# Patient Record
Sex: Female | Born: 1985 | Race: White | Hispanic: No | Marital: Married
Health system: Midwestern US, Academic
[De-identification: ages and names within clinical notes are randomized; demographics above are authoritative.]

---

## 2021-03-21 IMAGING — CR [ID]
3 series · 3 of 3 positions shown · non-contrast
Comparison: none

[foot ap]
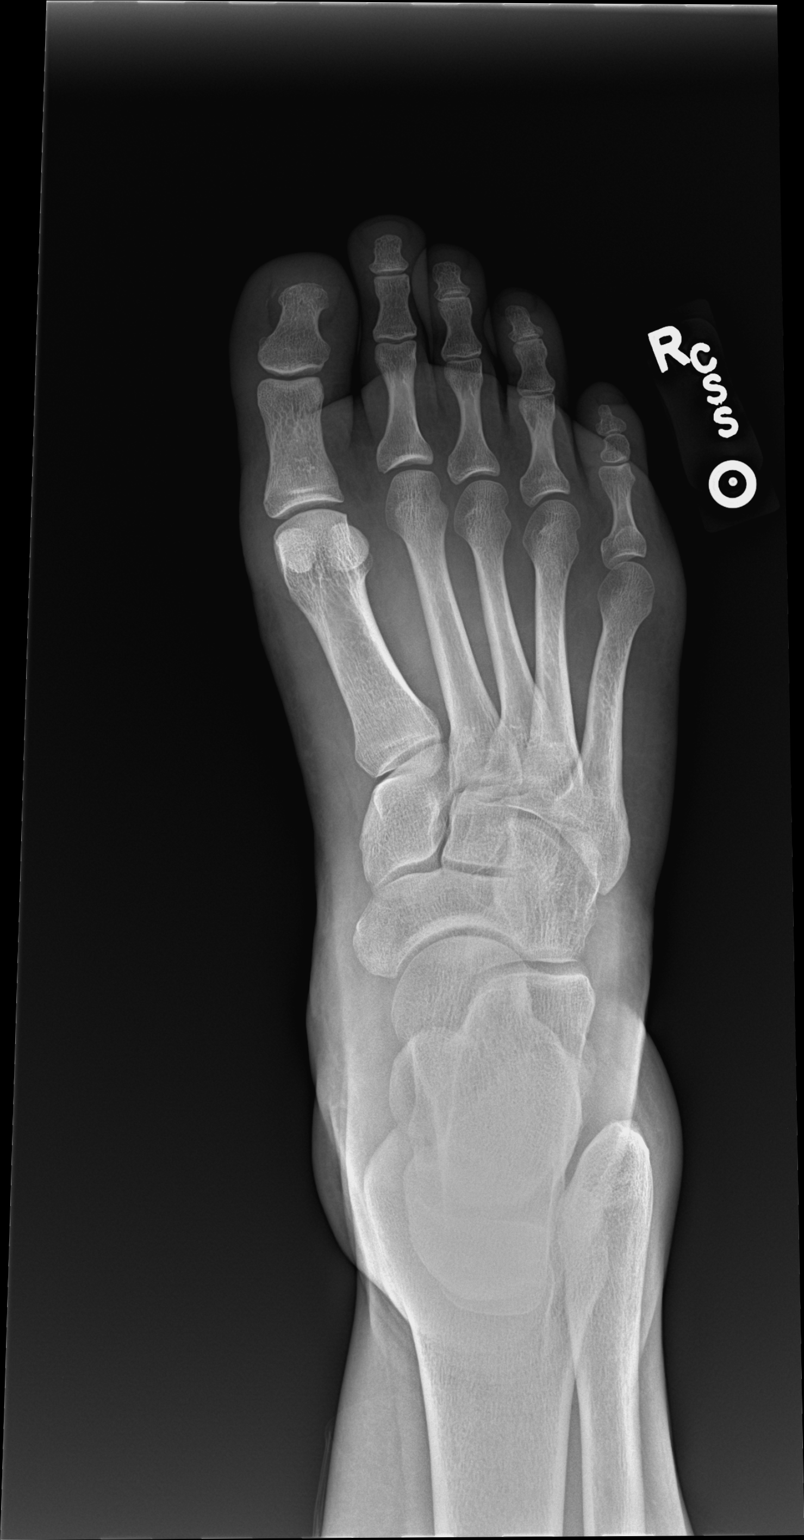

[foot obl]
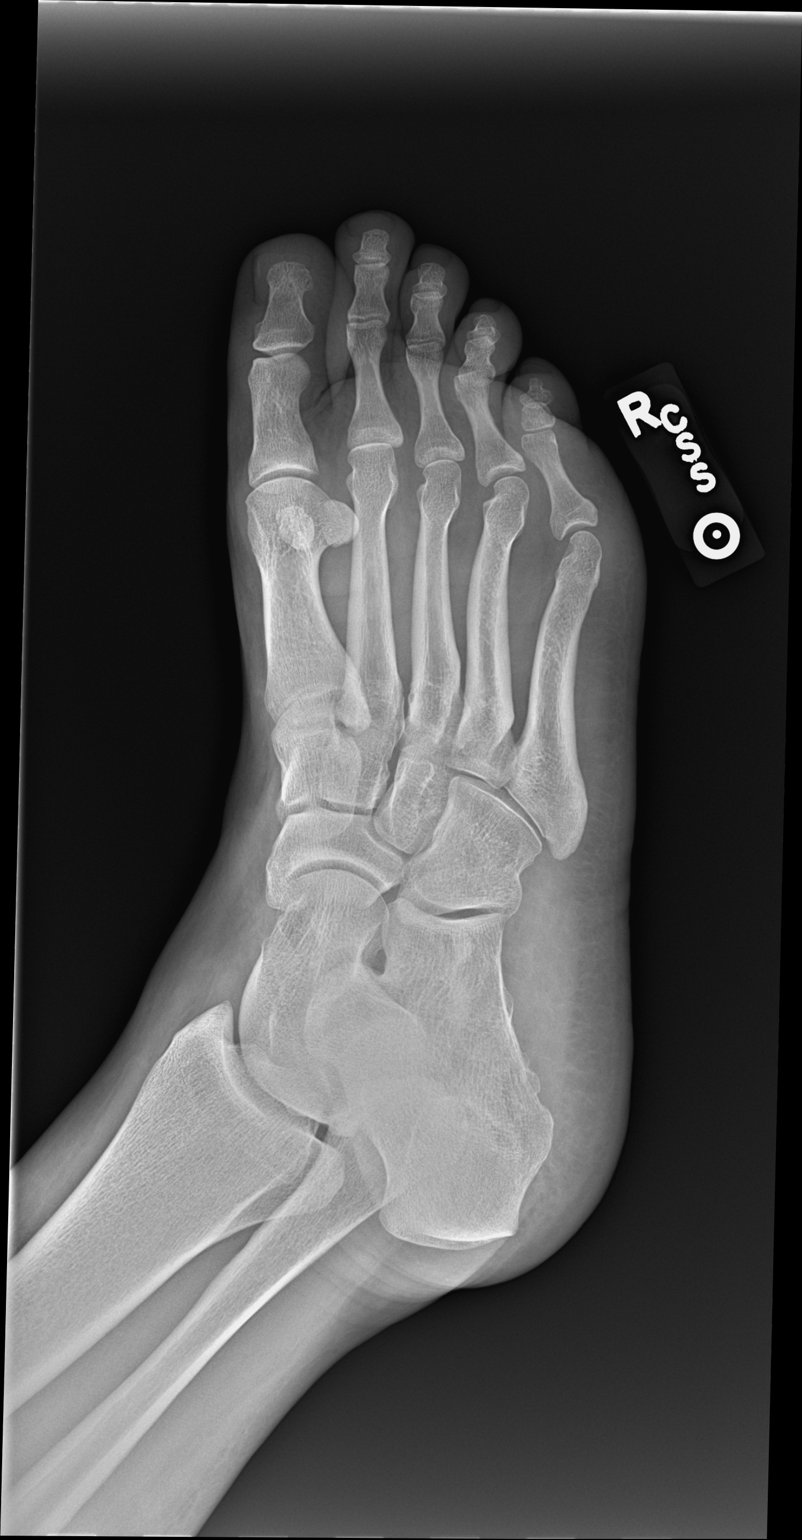

[foot lat]
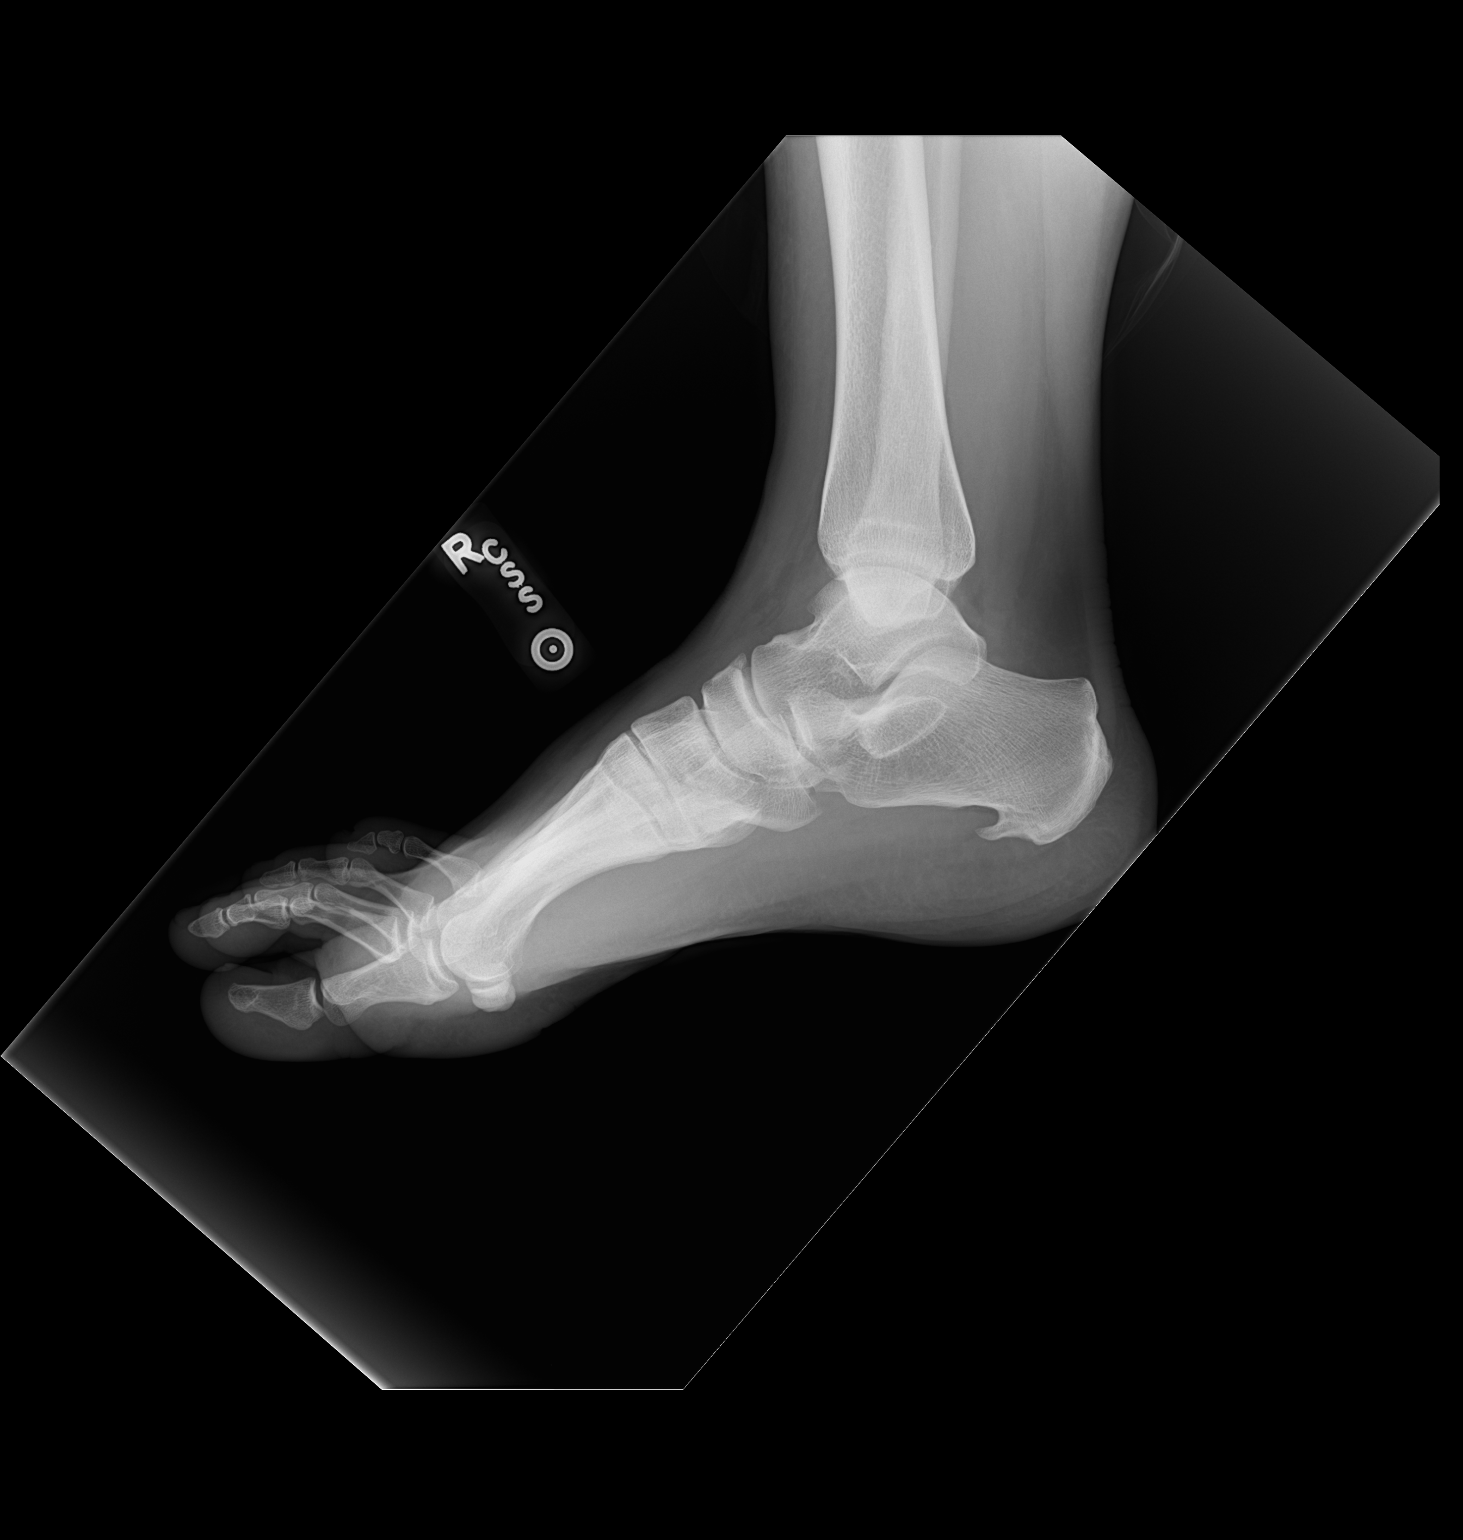

[3 of 3 positions shown; findings below may reference images not displayed]

EXAM

RADIOLOGICAL EXAMINATION, FOOT

INDICATION

Right foot pain x 1 month, worsening past few days
Right Foot Pain.CS

TECHNIQUE

3  views of the foot were acquired.

COMPARISONS

None

FINDINGS

The bone density and joint spaces are well maintained.

There are no displaced fractures.

No dislocation seen.

Calcaneal spurring is present

IMPRESSION

No acute bony abnormality.

Tech Notes:

Right Foot Pain.CS

## 2021-04-19 IMAGING — MR Foot^Routine
4 series · 40 of 40 positions shown · non-contrast
Comparison: none

[Series 3: PD fat-sat · oblique · 4.5mm · 0.47mm/px · 21 of 23 slices shown]
[im 1/23]
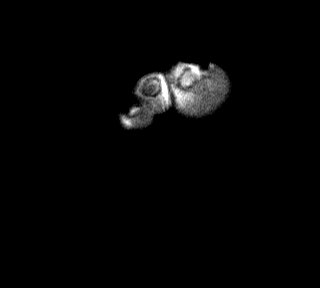
[im 2/23]
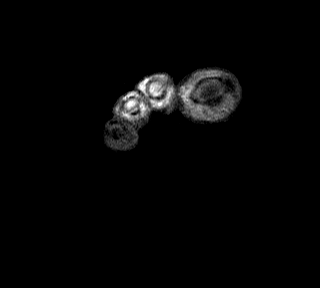
[im 3/23]
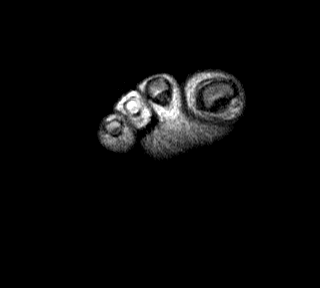
[im 4/23]
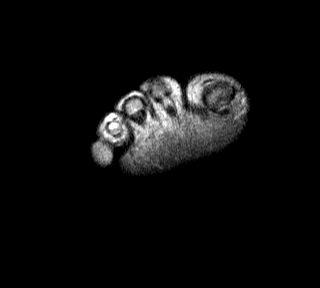
[im 5/23]
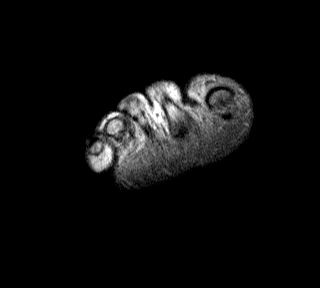
[im 6/23]
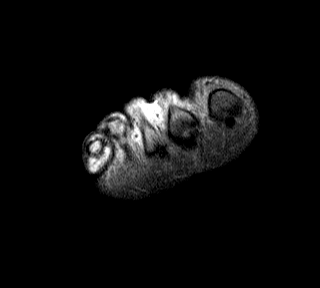
[im 7/23]
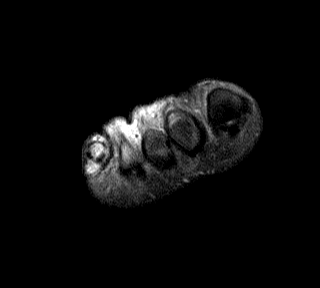
[im 8/23]
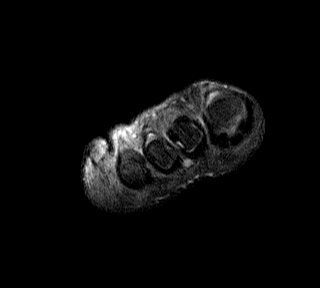
[im 9/23]
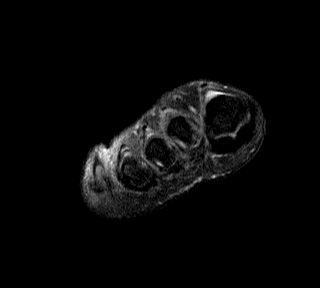
[im 10/23]
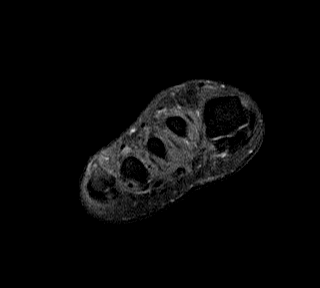
[im 12/23]
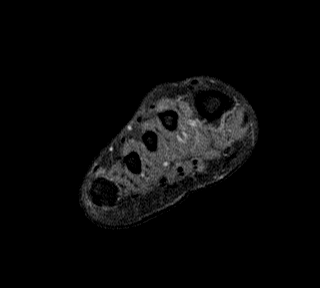
[im 13/23]
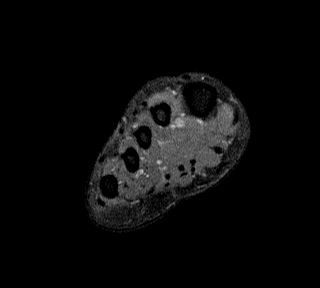
[im 14/23]
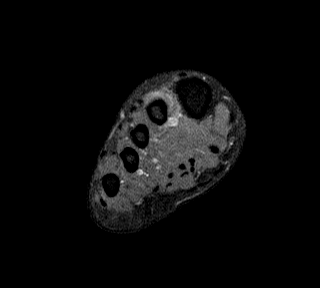
[im 15/23]
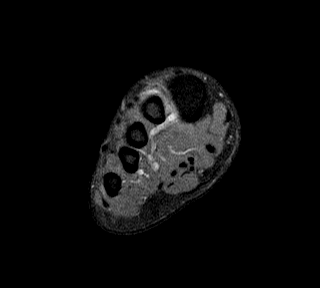
[im 16/23]
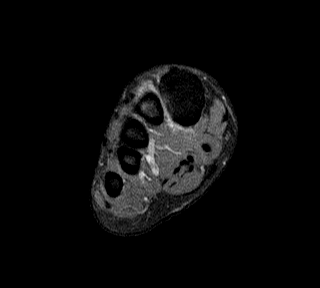
[im 17/23]
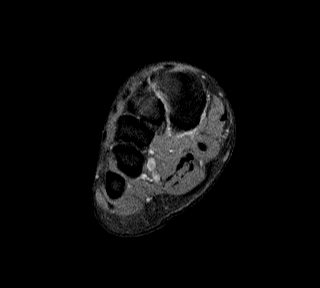
[im 18/23]
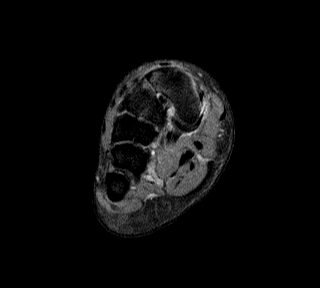
[im 19/23]
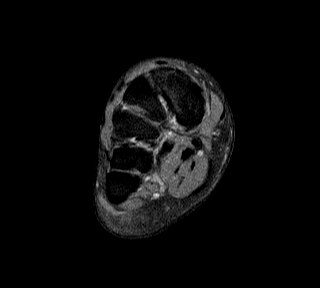
[im 20/23]
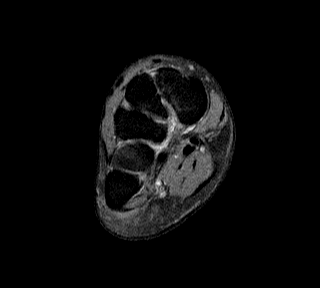
[im 21/23]
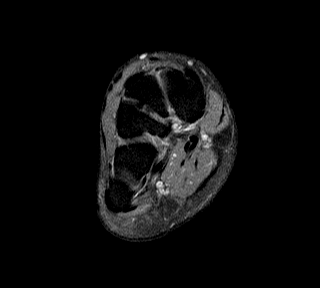
[im 23/23]
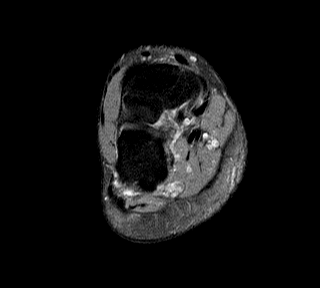

[Series 4: T1 · oblique · 4.0mm · 0.75mm/px · 12 of 13 slices shown (1 of 3)]
[im 1/13]
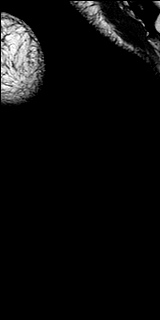
[im 2/13]
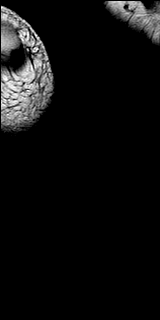
[im 3/13]
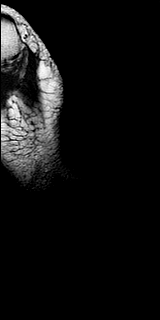
[im 4/13]
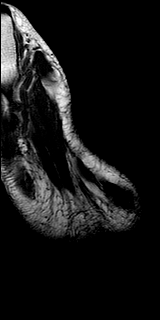
[im 5/13]
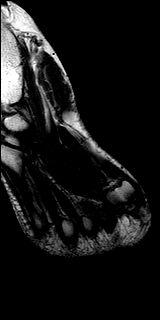
[im 6/13]
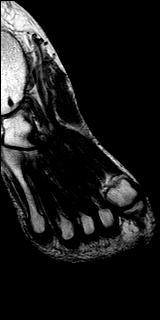
[im 7/13]
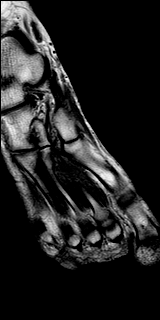
[im 8/13]
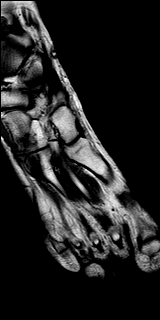
[im 9/13]
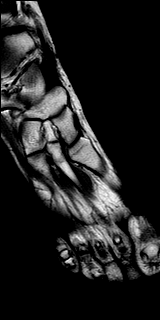
[im 10/13]
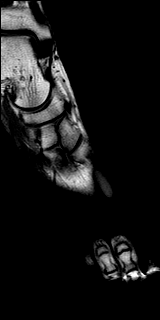
[im 11/13]
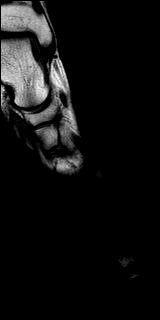
[im 13/13]
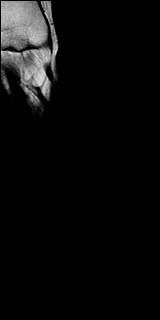

[Series 7: T1 · oblique · 3.5mm · 0.69mm/px · 3 of 3 slices shown (2 of 3)]
[im 1/3]
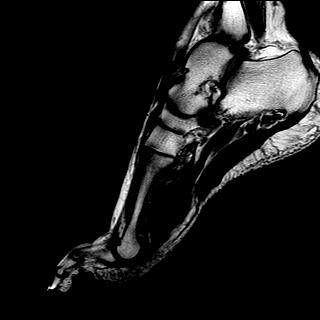
[im 2/3]
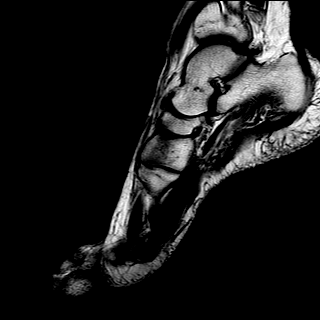
[im 3/3]
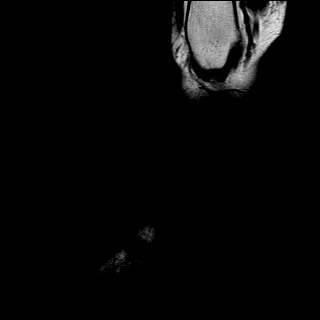

[Series 8: T1 · oblique · 4.0mm · 0.75mm/px · 4 of 4 slices shown (3 of 3)]
[im 1/4]
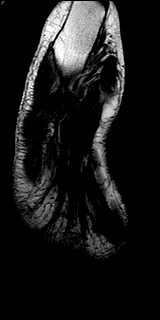
[im 2/4]
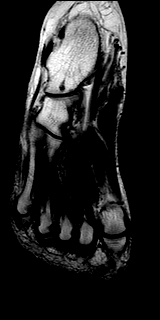
[im 3/4]
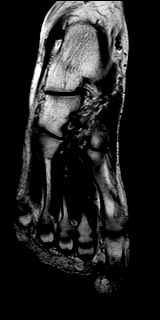
[im 4/4]
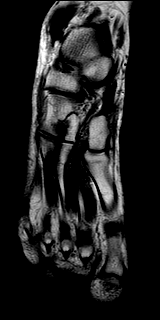

[40 of 40 positions shown; findings below may reference images not displayed]

EXAM

MRI right foot without contrast

INDICATION

pain
RT TOP OF FOOT PAIN, NOW RADIATING TO PLANTAR SURFACE, WORSE BETWEEN THE 1ST AND 2ND METATARSAL.
HX PLANTAR FASCITIS WITH SX TREATMENT.  RG

FINDINGS

Coronal T1, fat suppressed proton density and stir, sagittal T1 and stir and axial proton density
and stir images of the right foot were obtained.

There is a linear areas signal abnormality and marrow edema throughout the base of the 2nd
metatarsal.

The 1st, 3rd, 4th and 5th metatarsals appear normal. There is mild marrow edema within the distal
and lateral portion of the medial cuneiform.

The middle and lateral cuneiforms and cuboid are intact.

No widening of the Lisfranc joint space is seen. There is mild edema of the Lisfranc ligament.

IMPRESSION

There is evidence of a nondisplaced stress fracture of the base of the 2nd metatarsal with
associated marrow edema. There is also marrow edema of distal lateral portion of the medial cuneifo
rm, most likely representing stress response. There is mild adjacent soft tissue edema. There is
mild edema of the Lisfranc ligament, suggestive of a ligament strain.

Tech Notes:

RT TOP OF FOOT PAIN, NOW RADIATING TO PLANTAR SURFACE, WORSE BETWEEN THE 1ST AND 2ND METATARSAL.  HX
PLANTAR FASCITIS WITH SX TREATMENT.  RG

## 2021-04-19 IMAGING — CR ANKCMLT
3 series · 3 of 3 positions shown · non-contrast
Comparison: none

[ankle ap]
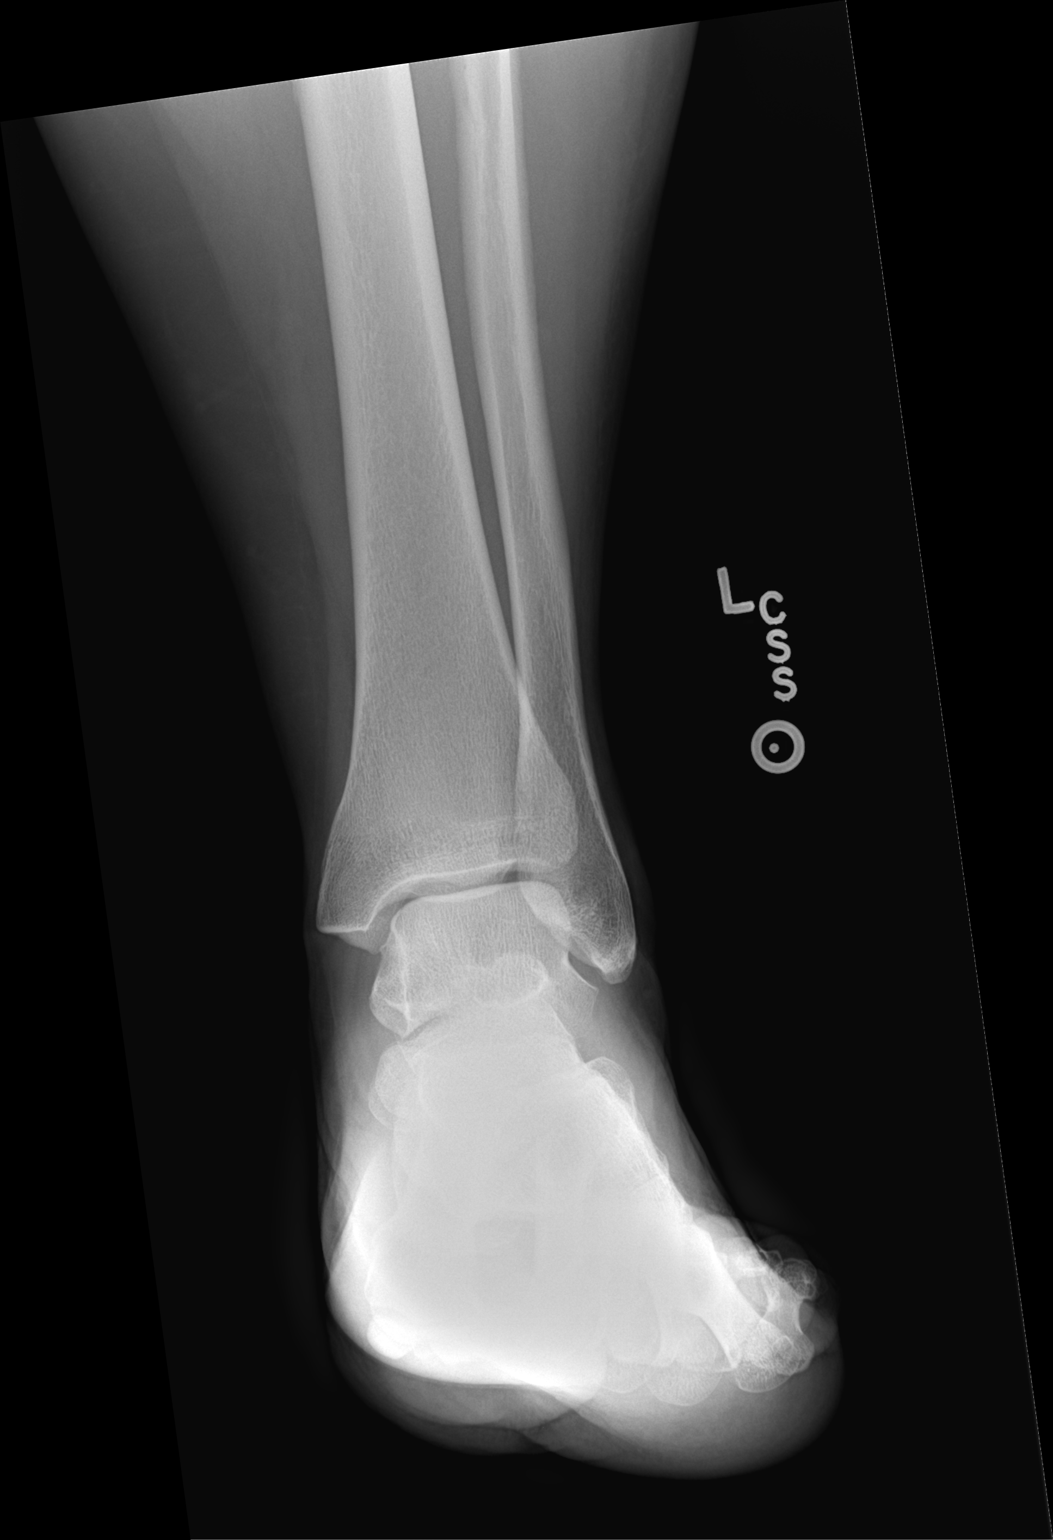

[ankle obl]
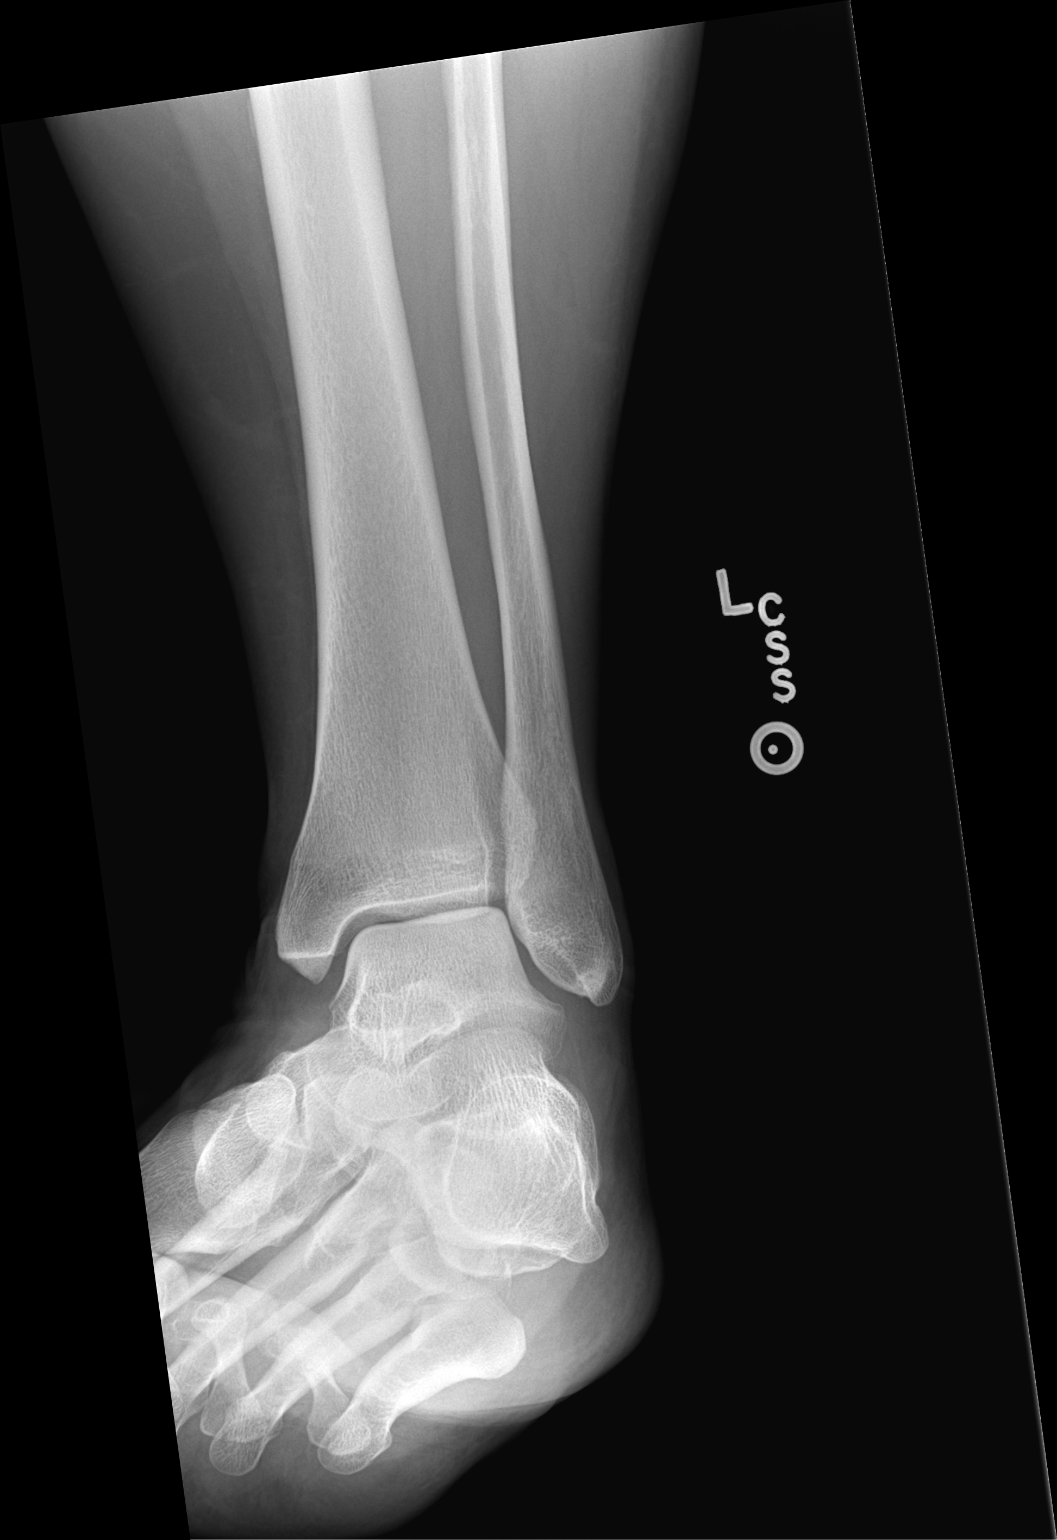

[ankle lat]
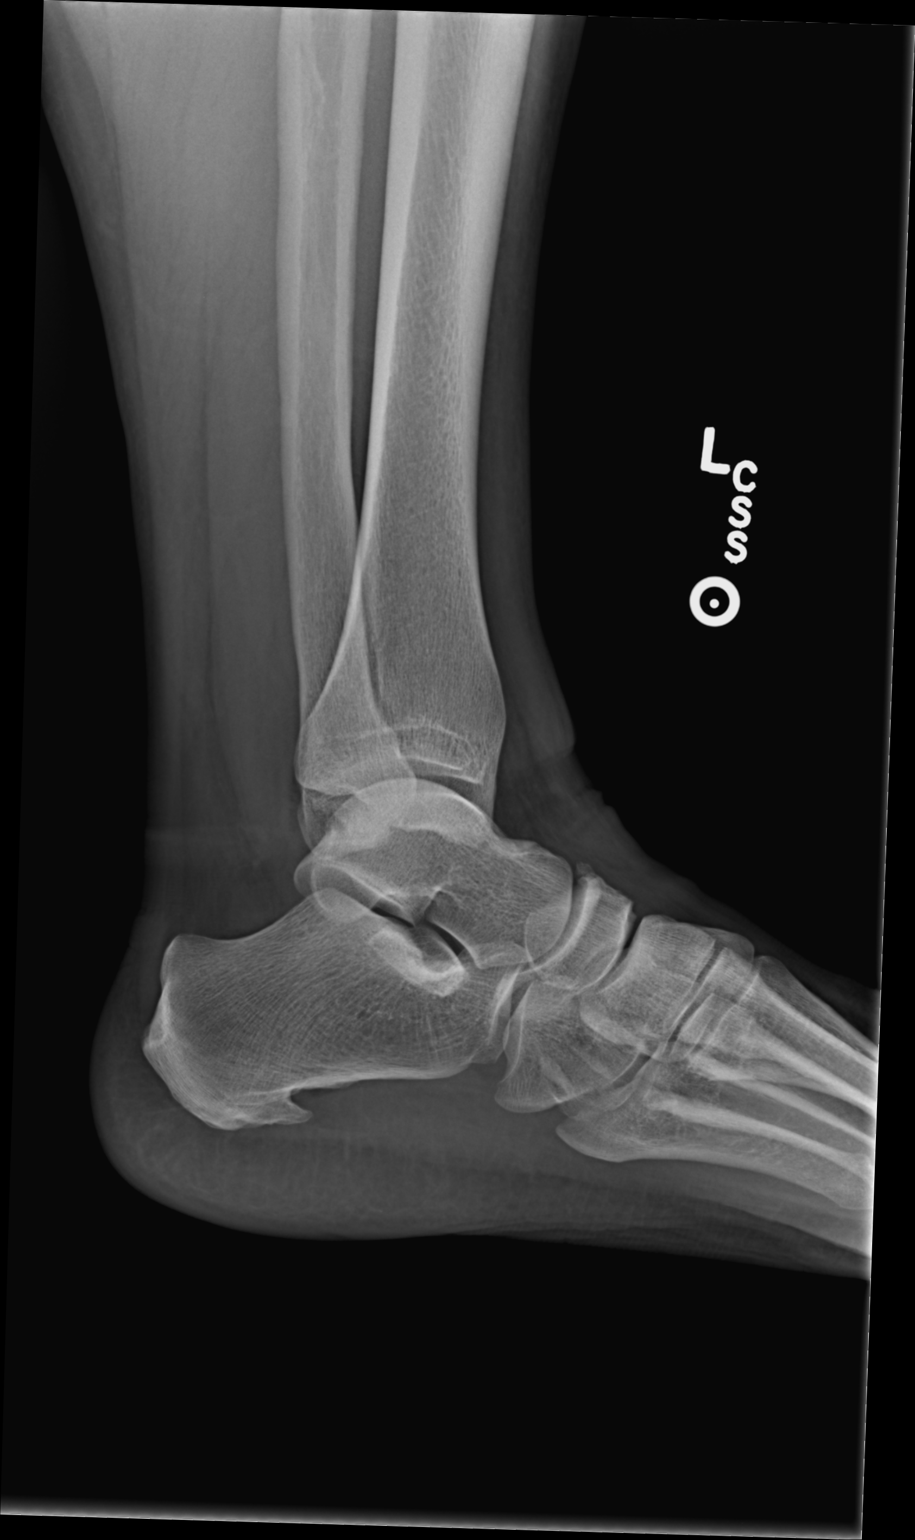

[3 of 3 positions shown; findings below may reference images not displayed]

EXAM

Left ankle

INDICATION

ankle pain
Left ankle pain. Stepped in a hole about 3 weeks ago and pain started a couple days later. Pain is
on the mdial side locally to the ankle area. CS

FINDINGS

Three views of the left ankle were obtained.

There is no evidence of fracture or dislocation.

There is a small heel spur at the insertion site of the plantar fascia.

IMPRESSION

There is no significant radiographic abnormality of the left ankle. There is a small heel spur.

Tech Notes:

Left ankle pain. Stepped in a hole about 3 weeks ago and pain started a couple days later. Pain is
on the mdial side locally to the ankle area. CS

## 2021-08-29 IMAGING — CT ABDOMEN_PELVIS WO(Adult)
2 of 3 series · 12 of 46 positions shown, 14 images · non-contrast
Comparison: none

[Series 2: abdomen_pelvis ax 3.00 br40 s3 · axial · 0.66mm/px · z∈[+1217,+1667]mm · 9 of 174 slices shown, 11 images]
[im 12/174  soft-tissue]
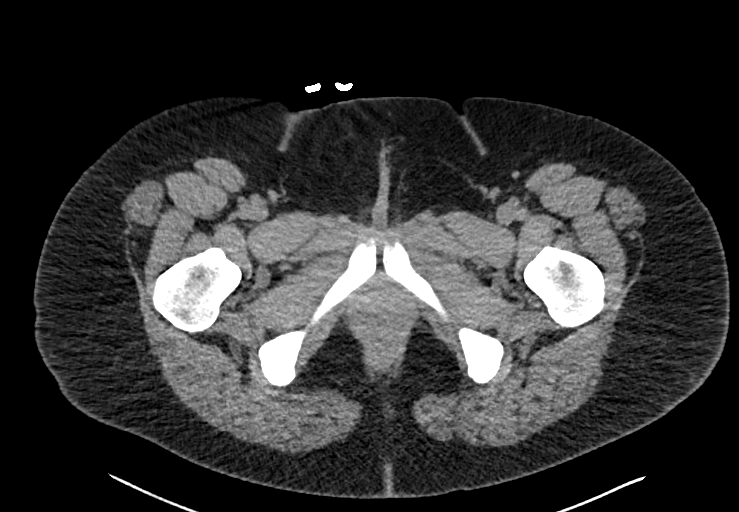
[im 12/174  bone]
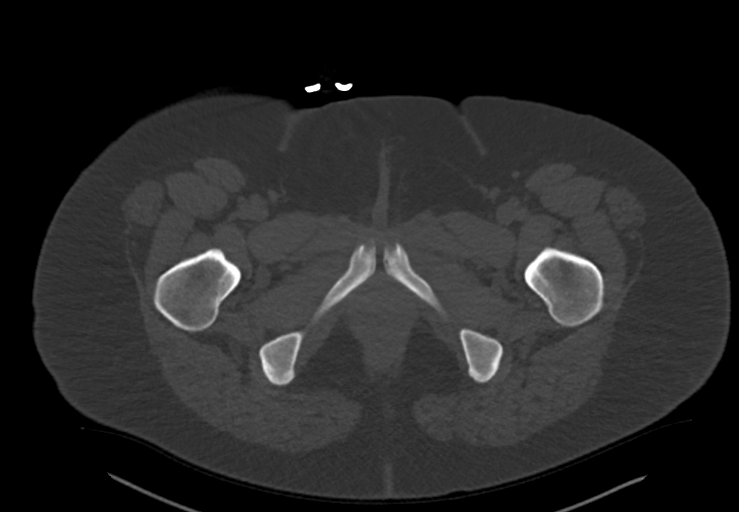
[im 34/174  soft-tissue]
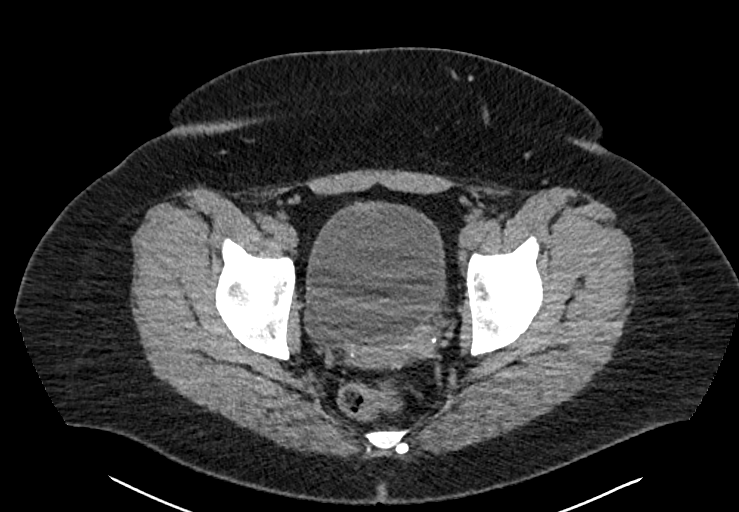
[im 51/174  soft-tissue]
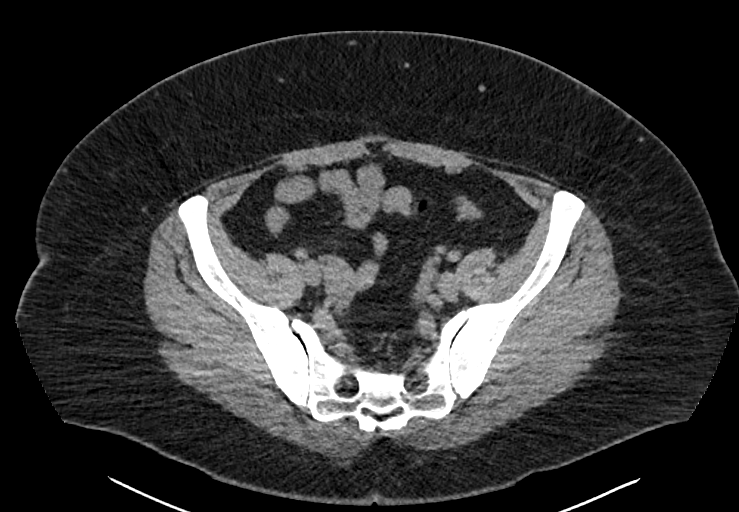
[im 67/174  soft-tissue]
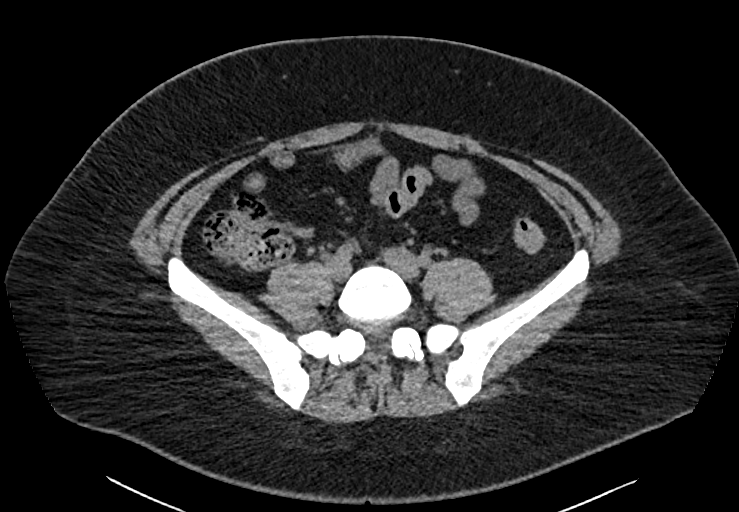
[im 90/174  soft-tissue]
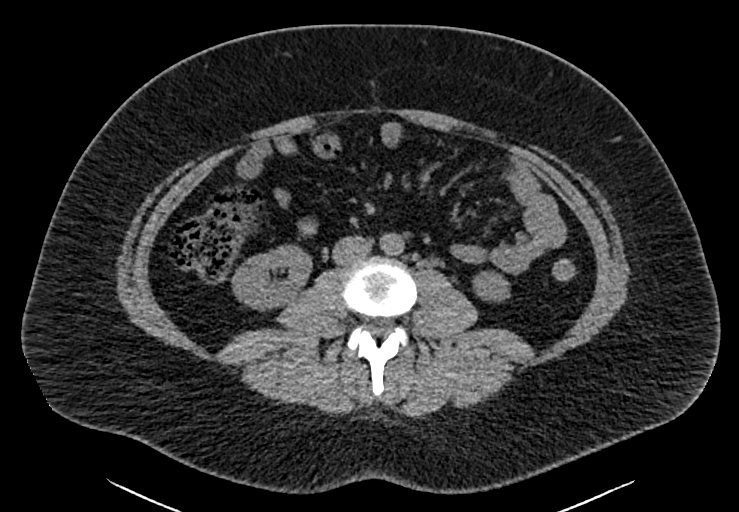
[im 107/174  soft-tissue]
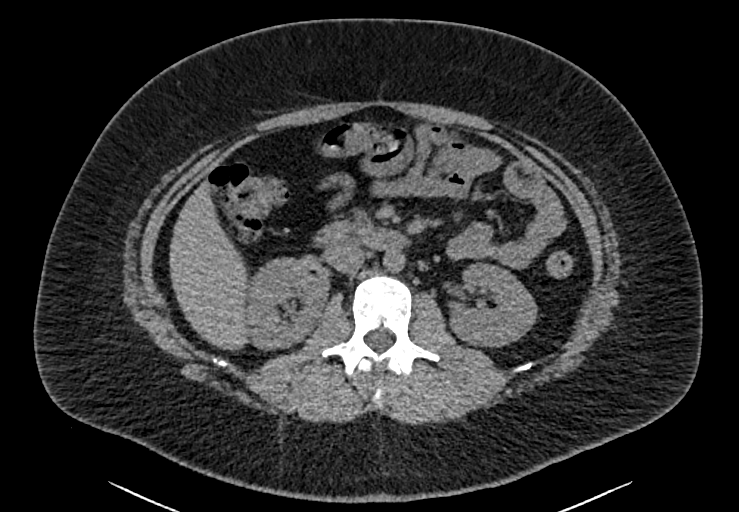
[im 123/174  soft-tissue]
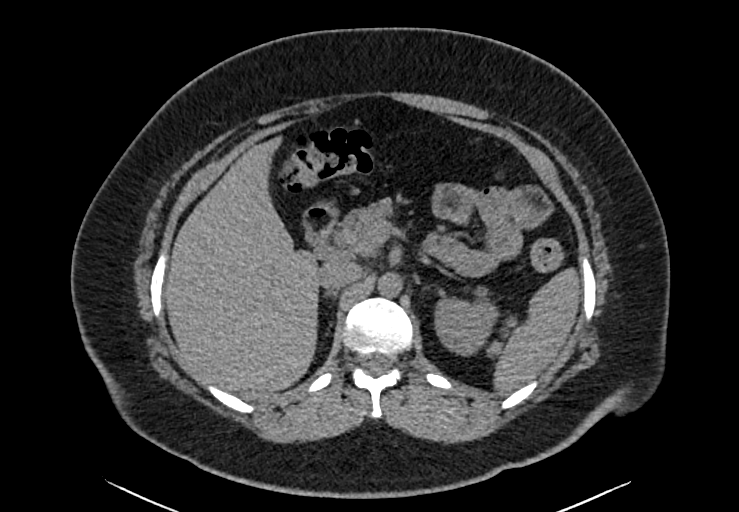
[im 146/174  soft-tissue]
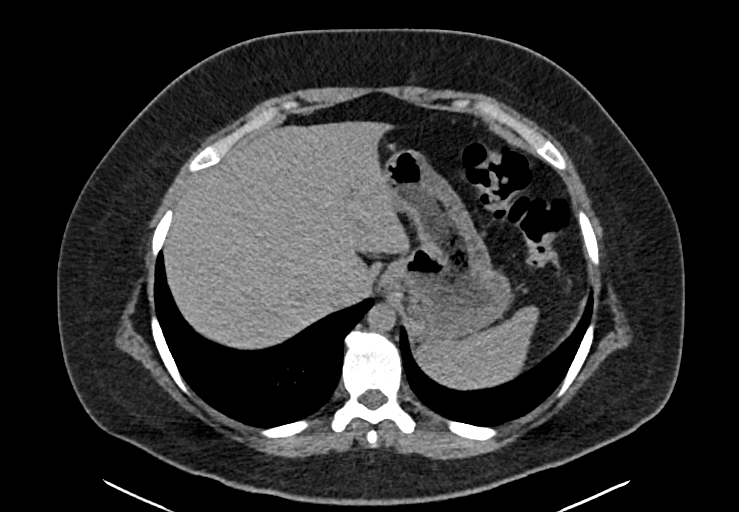
[im 162/174  soft-tissue]
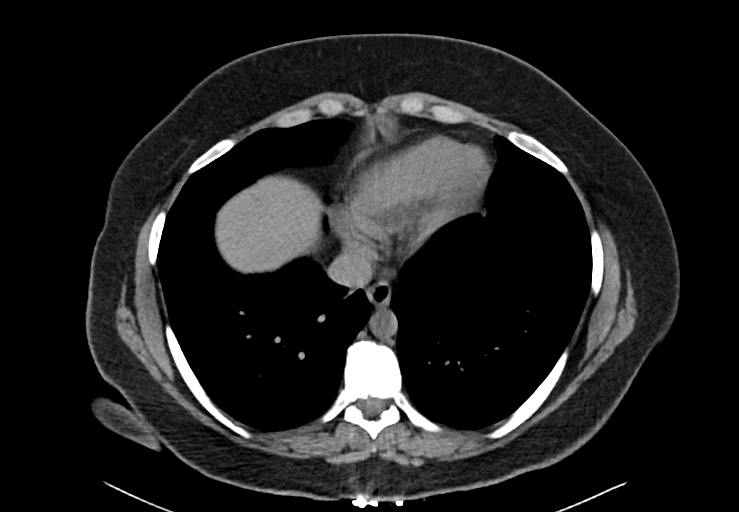
[im 162/174  bone]
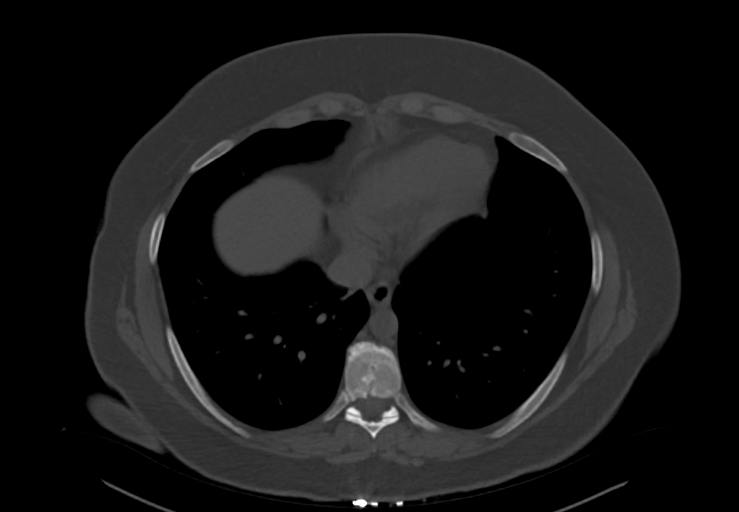

[Series 4: abdomen_pelvis cor 3.00 br40 s3 · coronal · 0.96mm/px · 3 of 113 slices shown]
[im 38/113  soft-tissue]
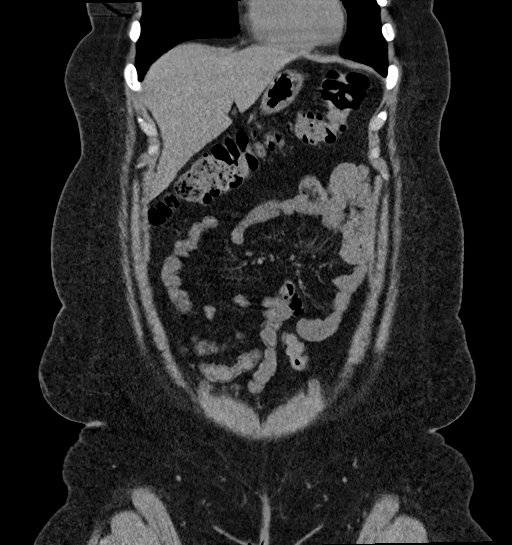
[im 50/113  soft-tissue]
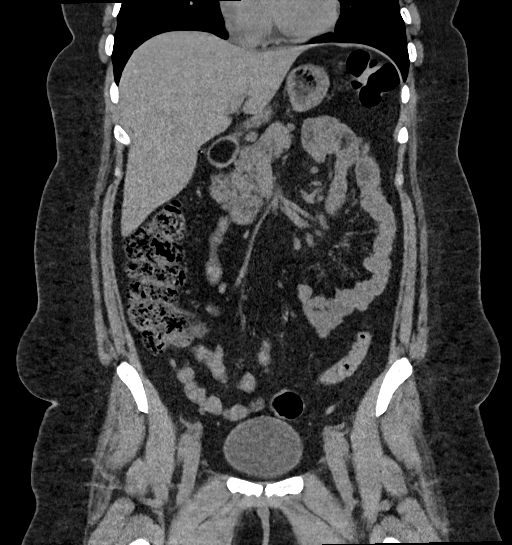
[im 63/113  soft-tissue]
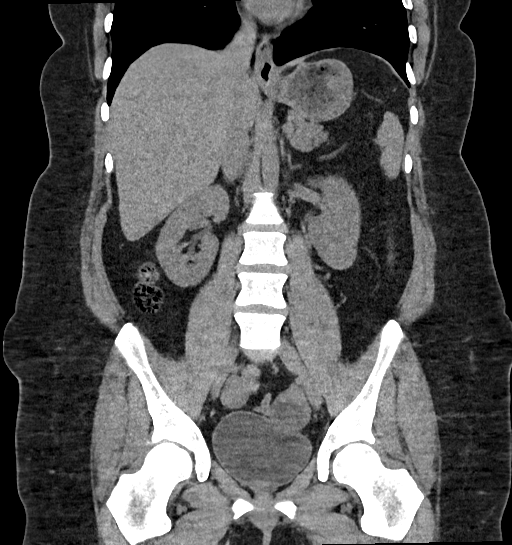

[12 of 46 positions shown; findings below may reference images not displayed]

EXAM

CT abd/pel wo con

INDICATION

lower pain  bloody stool
lower abd/pelvis pain for the last few days that is increasing- bloody stool, hx of ovarian cysts,
hx of hysterectomy, cholecystectomy, no trauma to abd. ct/nm 0/0

TECHNIQUE

CT of the abdomen and pelvis was performed. All CT scans at this facility use dose modulation,
iterative reconstruction, and/or weight based dosing when appropriate to reduce radiation dose to as
low as reasonably achieved.

# of CT scans in the past year: 0

# of Myocardial perfusion scans this past year: 0

COMPARISONS

None available at the time of dictation.

FINDINGS

Lung bases: No pleural effusion or suspicious pulmonary nodule in the lung bases. Normal cardiac
size without a pericardial effusion.

Liver: The liver is enlarged measuring 21 cm in the right midclavicular line

Gallbladder and Biliary Tree: The gallbladder is been removed. No intrahepatic or extrahepatic
biliary dilation.

Spleen: Unremarkable

Pancreas: Unremarkable

Adrenal Glands: Unremarkable

Kidneys: No hydronephrosis or hydroureter is present.

Bladder: Unremarkable for the degree of distention.

Pelvic Organs: A left-sided ovarian cyst is present measuring up to 4.7 cm. Hysterectomy changes
are seen.

Bowel: The stomach is normal.   There is normal caliber of the small and large bowel without
evidence of bowel obstruction.   A normal appendix is visualized in the right lower quadrant.  There
is no free air.

Ascites: Absent

Lymphadenopathy: No pathologically enlarged or morphologically abnormal lymph nodes by CT
appearance.

Vasculature: There is normal opacification of the visualized abdominal/pelvic vasculature without
evidence of stenosis or aneurysmal dilation.

Abdominal Wall and Mesentery: Unremarkable

Musculoskeletal: Degenerative changes of the visualized spine without evidence of an aggressive
osseous lesion or fracture.

IMPRESSION
1. No CT evidence of an acute abdominal or pelvic process.
2. A 4.7 cm left-sided ovarian cyst is present.
3. Hepatomegaly.

Tech Notes:

lower abd/pelvis pain for the last few days that is increasing- bloody stool, hx of ovarian cysts,
hx of hysterectomy, cholecystectomy, no trauma to abd. ct/nm 0/0

## 2021-12-11 ENCOUNTER — Encounter: Admit: 2021-12-11 | Discharge: 2021-12-11

## 2021-12-14 ENCOUNTER — Encounter: Admit: 2021-12-14 | Discharge: 2021-12-14

## 2021-12-24 ENCOUNTER — Encounter: Admit: 2021-12-24 | Discharge: 2021-12-24 | Payer: BC Managed Care – PPO

## 2021-12-24 DIAGNOSIS — R69 Illness, unspecified: Secondary | ICD-10-CM

## 2022-01-10 ENCOUNTER — Ambulatory Visit: Admit: 2022-01-10 | Discharge: 2022-01-10 | Payer: BC Managed Care – PPO

## 2022-01-10 ENCOUNTER — Encounter: Admit: 2022-01-10 | Discharge: 2022-01-10 | Payer: BC Managed Care – PPO

## 2022-01-10 DIAGNOSIS — R519 Generalized headaches: Secondary | ICD-10-CM

## 2022-01-10 DIAGNOSIS — M5030 Other cervical disc degeneration, unspecified cervical region: Secondary | ICD-10-CM

## 2022-01-10 DIAGNOSIS — D539 Nutritional anemia, unspecified: Secondary | ICD-10-CM

## 2022-01-10 DIAGNOSIS — M5136 Other intervertebral disc degeneration, lumbar region: Secondary | ICD-10-CM

## 2022-01-10 DIAGNOSIS — M461 Sacroiliitis, not elsewhere classified: Secondary | ICD-10-CM

## 2022-01-10 DIAGNOSIS — R112 Nausea with vomiting, unspecified: Secondary | ICD-10-CM

## 2022-01-10 DIAGNOSIS — F32A Depression: Secondary | ICD-10-CM

## 2022-01-10 DIAGNOSIS — M2550 Pain in unspecified joint: Secondary | ICD-10-CM

## 2022-01-10 DIAGNOSIS — F419 Anxiety disorder, unspecified: Secondary | ICD-10-CM

## 2022-01-10 DIAGNOSIS — M47816 Spondylosis without myelopathy or radiculopathy, lumbar region: Secondary | ICD-10-CM

## 2022-01-10 DIAGNOSIS — M7918 Myalgia, other site: Secondary | ICD-10-CM

## 2022-01-10 MED ORDER — BUPIVACAINE (PF) 0.5 % (5 MG/ML) IJ SOLN
4 mL | Freq: Once | INTRAMUSCULAR | 0 refills | Status: CP | PRN
Start: 2022-01-10 — End: ?
  Administered 2022-01-10: 18:00:00 4 mL via INTRAMUSCULAR

## 2022-01-10 MED ORDER — METHOCARBAMOL 500 MG PO TAB
500 mg | ORAL_TABLET | Freq: Three times a day (TID) | ORAL | 1 refills | Status: AC | PRN
Start: 2022-01-10 — End: ?

## 2022-01-10 NOTE — Progress Notes
[facility]         HISTORY OF PRESENT ILLNESS:      Referring physician: Lucita Lora    Chief complaint: Pain of the Lower Back and Pain of the Neck      01/10/22  Sophia Wood is a 36 y.o. female who  has a past medical history of Anxiety, Degenerative disc disease, cervical, Degenerative disc disease, lumbar, Depression (2008), Generalized headaches (1995), Joint pain, PONV (postoperative nausea and vomiting), and Unspecified deficiency anemia.  She primarily presents today for low back pain.  This started several years ago but became much worse in early 2021.  It has progressively worsened further since that time.  The pain is constant, aching, and sharp.  The pain is moderate to severe.  It does not radiate to the lower extremities.  She denies any numbness, tingling, or weakness.  She denies any particular injury that caused this pain.  She has tried physical therapy on several occasions with ongoing home exercises which have not provided much relief.  She has tried ibuprofen, diclofenac, gabapentin, Flexeril, and tizanidine without much benefit.  She has seen a couple of pain physicians.  She has severe body pain with steroid injections, that she has never had any steroid injections for her low back problems.  She had lumbar medial branch blocks which helped temporarily, but she actually felt worse after her lumbar RFA.  The most effective procedure thus far has been bilateral SI joint injections of ropivacaine only.  This was in January 2023.  She reported 80% relief lasting about 45 minutes.  She did have some numbness temporarily in her right leg after the injection.  She has never had any surgery on her lower back.  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    Her secondary complaint is neck pain.  This has been present since before age 17.  She believes it started on around the time that she dove into a lake and hit her head.  She is also tried physical therapy several times for this.  She has rarely noticed radiation of pain into the right shoulder and the arm, but this is mostly just in her bilateral neck.  The pain is constant and aching.  She has trouble getting comfortable.  She sometimes has headaches when her neck pain is more severe.      Patient-entered data is noted with quotes.    Pain location: (P) Lower back  Pain radiation:   Does the pain move into your arm or leg?: (P) Yes  Explanation of how far pain moves:: (P) Occasionally moves into leg, could be right or left  Pain start date: (P) Greater than 1 year     Inciting event: (P) No       Description of pain: (P) Aching, Tiring, Nagging, Burning, Sharp    Numbness/tingling/weakness: (P) None      Bladder or bowel retention or incontinence?: No  Exacerbating factors: (P) Stand, Walk      Alleviating factors: (P) Sitting down      Imaging:   Have you had an X-ray for this condition?: (P) Yes  Have you had an MRI for this condition?: (P) Yes  Physical therapy/home exercise:   Have you had physical therapy for this condition?: (P) Yes  What was the approximate date physical therapy was started?: (P) 11/22/19  How long did you do physical therapy? (in weeks): (P) 26  Anticoagulants:  Are you taking one of the following blood thinners?: (P) No      Pain diagram:               Medical History:   Diagnosis Date   ? Anxiety    ? Degenerative disc disease, cervical    ? Degenerative disc disease, lumbar    ? Depression 2008    Remission   ? Generalized headaches 1995   ? Joint pain    ? PONV (postoperative nausea and vomiting)    ? Unspecified deficiency anemia          Surgical History:   Procedure Laterality Date   ? ABDOMEN SURGERY     ? HX ARTHROSCOPIC SURGERY      B) knees   ? HX HYSTERECTOMY  2021   ? HX TONSILLECTOMY  2022   ? KNEE SURGERY  2012, 2020         Allergies   Allergen Reactions   ? Flonase [Fluticasone] RASH   ? Corticosteroids (Glucocorticoids) SEE COMMENTS     Joint pain   ? Latex ITCHING         Current Outpatient Medications on File Prior to Visit   Medication Sig Dispense Refill   ? CHOLEcalciferoL (vitamin D3) (VITAMIN D3) 5000 unit tablet      ? cyclobenzaprine (FLEXERIL) 5 mg tablet as Needed.     ? desvenlafaxine succinate (PRISTIQ) 100 mg tablet Take one tablet by mouth every morning.     ? hydrOXYzine pamoate (VISTARIL) 25 mg capsule TAKE 1-2 CAPSULES BY MOUTH AT BEDTIME, AS NEEDED FOR SLEEP     ? LORazepam (ATIVAN) 1 mg tablet Take one tablet by mouth every 6 hours as needed.     ? MAGNESIUM GLUCONATE (BULK) MISC      ? metFORMIN (GLUCOPHAGE) 500 mg tablet Take one tablet by mouth every evening.     ? multivit with iron,minerals (MULTIVITAMIN AND MINERALS PO)      ? vitamin E 400 unit capsule        No current facility-administered medications on file prior to visit.         family history includes Arthritis in her maternal grandmother and mother; Back pain in her maternal grandfather and mother; Hypertension in her father and paternal grandmother; Joint Pain in her maternal grandmother and mother; Neck Pain in her mother; Stroke in her mother and paternal grandmother.      Social History     Socioeconomic History   ? Marital status: Married   Tobacco Use   ? Smoking status: Never   ? Smokeless tobacco: Never   Substance and Sexual Activity   ? Alcohol use: Never   ? Drug use: Never   ? Sexual activity: Yes     Partners: Male     Birth control/protection: Surgical               Review of Systems   Musculoskeletal: Positive for back pain, neck pain and neck stiffness.   All other systems reviewed and are negative.        PHYSICAL EXAM:    Vitals:    01/10/22 1230   BP: 119/68   BP Source: Arm, Left Upper   Pulse: 93   Temp: 36.7 ?C (98 ?F)   SpO2: 99%   TempSrc: Temporal   PainSc: Six   Weight: 120.2 kg (265 lb)  Comment: per pt   Height: 177.8 cm (5' 10)     Oswestry  Total Score:: 38  Pain Score: Six  Body mass index is 38.02 kg/m?Marland Kitchen    General: Alert, cooperative, no acute distress.  HEENT: Normocephalic, atraumatic.  Neck: Supple.  Lungs: Unlabored respirations, bilateral and equal chest excursion.  Heart: Regular rate.  Skin: Warm and dry to touch.  Abdomen: Nondistended.    Cervical spine:  Cervical tenderness: Yes, increased muscle tone in paraspinals and upper trapezius  Pain with extension: Yes  Pain with lateral flexion: Bilateral  Limited neck ROM: No  Sensation to light touch - left upper extremity: intact  Sensation to light touch - right upper extremity: intact  Hoffman sign: Negative bilaterally    Upper extremity strength:   Root Right Left   Shoulder Abduction C5 5 5   Elbow Flexion C5 5 5   Elbow Extension C7 5 5   Wrist Extension C6 5 5   Finger Flexion C8 5 5   Finger Abduction T1 5 5     Upper extremity reflexes:   Right Left   Biceps 2/4 2/4   Triceps 2/4 2/4   Brachioradialis 2/4 2/4       Lumbar spine:  Appearance: No lesions or deformity  Lumbar tenderness: Yes   SI joint tenderness: Positive bilaterally  Pain with extension: Yes  Pain with lateral flexion: None  Sensation to light touch - left lower extremity: intact  Sensation to light touch - right lower extremity: intact  Straight leg raise: Negative bilaterally  FABER test: Positive bilaterally  FADIR test: Negative bilaterally  Lateral pelvic compression: Reproduces bilateral SI joint pain  Thigh thrust: Reproduces bilateral SI joint pain  Gaenslen test: Deferred    Lower extremity strength:   Root Right Left   Hip Flexion L2 5 5   Knee Flexion L5/S1 5 5   Knee Extension L3 5 5   Dorsiflexion L4 5 5   Plantarflexion S1 5 5   EHL Extension L5 5 5     Lower extremity reflexes:   Right Left   Patellar 2/4 2/4   Achilles 2/4 2/4       Neurological: Alert and oriented x3.      RADIOGRAPHIC EVALUATION:    MRI L spine 07/20/21  1. Mild lumbar spondylosis. Facet arthropathy and ligamentum flavum   hypertrophy at multiple levels. No acute osseous abnormalities   demonstrated.     2. At the L4/L5 level, there is a concentric disc bulge with a tiny   focal annular tear which effaces the anterior thecal sac at this level.   There is facet arthropathy ligamentum flavum hypertrophy at this level.   There is at least mild or mild-to-moderate central canal stenosis at   this level.     3. There is no significant neural foraminal narrowing at?any level.     MRI C spine 07/20/21  Subtle linear signal changes within the cord at C6 most suggestive of a   subtle syrinx. The remainder the cervical spine is unremarkable without   disc protrusion or stenosis.       IMPRESSION:    1. Myofascial pain    2. Sacroiliitis (HCC)    3. Spondylosis of lumbar region without myelopathy or radiculopathy          PLAN:   Sophia Wood has bilateral low back pain most clinically consistent with sacroiliitis.  She had significant.  Very benefit after injection of local anesthetic.  She is in an unusual situation since she cannot tolerate steroids, since the  typical treatment would be to perform an SI joint steroid injection.  As such, I think she would be a good candidate for radiofrequency ablation of the bilateral sacral lateral branches to generate the bilateral SI joints.  We will schedule this procedure in the upcoming weeks.  She should continue home exercises.  I provided prescription for methocarbamol to see if this might be helpful for her.    Her neck pain appears to be myofascial in nature.  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  I provided trigger point injections today, see separate procedure note.

## 2022-01-11 NOTE — Procedures
Attending Surgeon: Philomena Course, MD    Anesthesia: Local    Pre-Procedure Diagnosis:   1. Myofascial pain    2. Sacroiliitis (HCC)    3. Spondylosis of lumbar region without myelopathy or radiculopathy        Post-Procedure Diagnosis:   1. Myofascial pain    2. Sacroiliitis (HCC)    3. Spondylosis of lumbar region without myelopathy or radiculopathy        Pain Score: Six    Trigger Point  Locations: L cervical, R cervical, R upper trapezius and L upper trapezius  Consent:   Consent obtained: written  Consent given by: patient    Discussed with patient the purpose of the treatment/procedure, other ways of treating my condition, including no treatment/ procedure and the risks and benefits of the alternatives. Patient has decided to proceed with treatment/procedure.        Universal Protocol:  Relevant documents: relevant documents present and verified  Test results: test results available and properly labeled  Imaging studies: imaging studies available  Required items: required blood products, implants, devices, and special equipment available  Site marked: the operative site was marked  Patient identity confirmed: Patient identify confirmed verbally with patient.        Time out: Immediately prior to procedure a time out was called to verify the correct patient, procedure, equipment, support staff and site/side marked as required      Procedures Details:   Indications: painPrep: alcohol  Needle size: 27 G  Number of muscles: 3 or more  Approach: posterior  Medications administered: 4 mL bupivacaine PF 0.5 %  Patient tolerance: Patient tolerated the procedure well with no immediate complications. Pressure was applied, and hemostasis was accomplished.  Comments: 1 mL of bupivacaine 0.5% injected into each of the above muscles.          Estimated blood loss: none or minimal  Specimens: none  Patient tolerated the procedure well with no immediate complications. Pressure was applied, and hemostasis was accomplished.    There is a regional pain complaint in the expected distribution of referral pain from a trigger point: Yes  Presence of point tenderness in a tight band of muscle in above area: Yes  Restricted range of motion in the affected area: Yes  Conservative therapy attempted for at least 6 weeks: Physical therapy, active exercises, heat/cold, massage, activity modification, pharmacotherapy  Other components of comprehensive management: Active exercises, pharmacotherapy, activity modification  No more than 4 trigger point injections have been given in the preceding 67-month period

## 2022-02-01 ENCOUNTER — Encounter: Admit: 2022-02-01 | Discharge: 2022-02-01 | Payer: BC Managed Care – PPO

## 2022-02-04 ENCOUNTER — Encounter: Admit: 2022-02-04 | Discharge: 2022-02-04 | Payer: BC Managed Care – PPO

## 2022-02-08 ENCOUNTER — Ambulatory Visit: Admit: 2022-02-08 | Discharge: 2022-02-08 | Payer: BC Managed Care – PPO

## 2022-02-08 ENCOUNTER — Encounter: Admit: 2022-02-08 | Discharge: 2022-02-08 | Payer: BC Managed Care – PPO

## 2022-02-08 DIAGNOSIS — M5030 Other cervical disc degeneration, unspecified cervical region: Secondary | ICD-10-CM

## 2022-02-08 DIAGNOSIS — R112 Nausea with vomiting, unspecified: Secondary | ICD-10-CM

## 2022-02-08 DIAGNOSIS — D539 Nutritional anemia, unspecified: Secondary | ICD-10-CM

## 2022-02-08 DIAGNOSIS — M2550 Pain in unspecified joint: Secondary | ICD-10-CM

## 2022-02-08 DIAGNOSIS — F419 Anxiety disorder, unspecified: Secondary | ICD-10-CM

## 2022-02-08 DIAGNOSIS — M461 Sacroiliitis, not elsewhere classified: Secondary | ICD-10-CM

## 2022-02-08 DIAGNOSIS — R519 Generalized headaches: Secondary | ICD-10-CM

## 2022-02-08 DIAGNOSIS — M5136 Other intervertebral disc degeneration, lumbar region: Secondary | ICD-10-CM

## 2022-02-08 DIAGNOSIS — F32A Depression: Secondary | ICD-10-CM

## 2022-02-08 MED ORDER — MIDAZOLAM 1 MG/ML IJ SOLN
1-2 mg | INTRAVENOUS | 0 refills | Status: AC | PRN
Start: 2022-02-08 — End: ?

## 2022-02-08 MED ORDER — FENTANYL CITRATE (PF) 50 MCG/ML IJ SOLN
50-100 ug | INTRAVENOUS | 0 refills | Status: AC | PRN
Start: 2022-02-08 — End: ?

## 2022-02-08 MED ORDER — LIDOCAINE (PF) 10 MG/ML (1 %) IJ SOLN
.2 mL | INTRAMUSCULAR | 0 refills | Status: AC | PRN
Start: 2022-02-08 — End: ?

## 2022-02-08 MED ORDER — LIDOCAINE (PF) 10 MG/ML (1 %) IJ SOLN
15 mL | Freq: Once | INTRAMUSCULAR | 0 refills | Status: CP
Start: 2022-02-08 — End: ?

## 2022-02-08 NOTE — Discharge Instructions - Supplementary Instructions
Post-procedure Discharge Instructions: Radiofrequency Ablation    Go directly home and rest. DO NOT drive today.    If you have a dressing or bandage on, you may remove it in 12 hours. You may shower tomorrow.     Apply ice to the procedure site at 20-minute intervals frequently for the next 24 hours.    If you had sedation for your procedure:  The anesthetic may make you drowsy and slow to react for up to 12 hours.  For the next 12 hours do not consume alcohol, drive, operate machinery, sign legal documents, or work.  Rest at home today.  A responsible adult needs to stay with you today and overnight.  Start with clear liquids and advance as tolerated.  Resume all previous medication unless directed not to.    It is not uncommon to experience an increase in pain for several days after the procedure.    The beneficial effects from the radiofrequency procedure may take several weeks to be noticed.    IV site: If a lump or redness occurs apply a moist compress for 10 minutes, 4 times a day for 2-3 days.  If this persists greater than 3 days notify your surgeon.    There should be minimal drainage and no swelling or redness at the injection sites. You should not experience a severe headache. You should not run a fever over 101F. If any of these occur, call to report this to a nurse at [phone]. If you are calling after 4PM or on weekends/holidays, call    [phone] and ask to have the on-call pain management resident paged or go to your local emergency room.    Follow up appointment as needed.     If any problems occur, or if you have further questions, contact your physician.  If you are unable to reach your physician and feel that your signs and symptoms warrant a physician's attention, please go to your nearest emergency room.

## 2022-02-08 NOTE — Progress Notes
[facility] HISTORY AND PHYSICAL         HISTORY OF PRESENT ILLNESS:      Chief complaint: No chief complaint on file.    Patient seen and examined. No changes to below H&P from 01/10/22.    01/10/22  Sophia Wood is a 36 y.o. female who  has a past medical history of Anxiety, Degenerative disc disease, cervical, Degenerative disc disease, lumbar, Depression (2008), Generalized headaches (1995), Joint pain, PONV (postoperative nausea and vomiting), and Unspecified deficiency anemia.  She primarily presents today for low back pain.  This started several years ago but became much worse in early 2021.  It has progressively worsened further since that time.  The pain is constant, aching, and sharp.  The pain is moderate to severe.  It does not radiate to the lower extremities.  She denies any numbness, tingling, or weakness.  She denies any particular injury that caused this pain.  She has tried physical therapy on several occasions with ongoing home exercises which have not provided much relief.  She has tried ibuprofen, diclofenac, gabapentin, Flexeril, and tizanidine without much benefit.  She has seen a couple of pain physicians.  She has severe body pain with steroid injections, that she has never had any steroid injections for her low back problems.  She had lumbar medial branch blocks which helped temporarily, but she actually felt worse after her lumbar RFA.  The most effective procedure thus far has been bilateral SI joint injections of ropivacaine only.  This was in January 2023.  She reported 80% relief lasting about 45 minutes.  She did have some numbness temporarily in her right leg after the injection.  She has never had any surgery on her lower back.  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    Her secondary complaint is neck pain.  This has been present since before age 36.  She believes it started on around the time that she dove into a lake and hit her head. She is also tried physical therapy several times for this.  She has rarely noticed radiation of pain into the right shoulder and the arm, but this is mostly just in her bilateral neck.  The pain is constant and aching.  She has trouble getting comfortable.  She sometimes has headaches when her neck pain is more severe.      Patient-entered data is noted with quotes.    Pain location: (P) Lower back  Pain radiation:   Does the pain move into your arm or leg?: (P) Yes  Explanation of how far pain moves:: (P) Occasionally moves into leg, could be right or left  Pain start date: (P) Greater than 1 year     Inciting event: (P) No       Description of pain: (P) Aching, Tiring, Nagging, Burning, Sharp    Numbness/tingling/weakness: (P) None      Bladder or bowel retention or incontinence?: No  Exacerbating factors: (P) Stand, Walk      Alleviating factors: (P) Sitting down      Imaging:   Have you had an X-ray for this condition?: (P) Yes  Have you had an MRI for this condition?: (P) Yes  Physical therapy/home exercise:   Have you had physical therapy for this condition?: (P) Yes  What was the approximate date physical therapy was started?: (P) 11/22/19  How long did you do physical therapy? (in weeks): (P) 26  Anticoagulants:  Are you taking one of the following blood thinners?: (P) No      Pain diagram:               Medical History:   Diagnosis Date   ? Anxiety    ? Degenerative disc disease, cervical    ? Degenerative disc disease, lumbar    ? Depression 2008    Remission   ? Generalized headaches 1995   ? Joint pain    ? PONV (postoperative nausea and vomiting)    ? Unspecified deficiency anemia          Surgical History:   Procedure Laterality Date   ? ABDOMEN SURGERY     ? HX ARTHROSCOPIC SURGERY      B) knees   ? HX HYSTERECTOMY  2021   ? HX TONSILLECTOMY  2022   ? KNEE SURGERY  2012, 2020         Allergies   Allergen Reactions   ? Flonase [Fluticasone] RASH   ? Corticosteroids (Glucocorticoids) SEE COMMENTS     Joint pain   ? Latex ITCHING         Current Outpatient Medications on File Prior to Encounter   Medication Sig Dispense Refill   ? CHOLEcalciferoL (vitamin D3) (VITAMIN D3) 5000 unit tablet      ? cyclobenzaprine (FLEXERIL) 5 mg tablet as Needed.     ? desvenlafaxine succinate (PRISTIQ) 100 mg tablet Take one tablet by mouth every morning.     ? hydrOXYzine pamoate (VISTARIL) 25 mg capsule TAKE 1-2 CAPSULES BY MOUTH AT BEDTIME, AS NEEDED FOR SLEEP     ? LORazepam (ATIVAN) 1 mg tablet Take one tablet by mouth every 6 hours as needed.     ? MAGNESIUM GLUCONATE (BULK) MISC      ? metFORMIN (GLUCOPHAGE) 500 mg tablet Take one tablet by mouth every evening.     ? methocarbamoL (ROBAXIN) 500 mg tablet Take one tablet by mouth three times daily as needed (Pain). 90 tablet 1   ? multivit with iron,minerals (MULTIVITAMIN AND MINERALS PO)      ? vitamin E 400 unit capsule        No current facility-administered medications on file prior to encounter.         family history includes Arthritis in her maternal grandmother and mother; Back pain in her maternal grandfather and mother; Hypertension in her father and paternal grandmother; Joint Pain in her maternal grandmother and mother; Neck Pain in her mother; Stroke in her mother and paternal grandmother.      Social History     Socioeconomic History   ? Marital status: Married   Tobacco Use   ? Smoking status: Never   ? Smokeless tobacco: Never   Substance and Sexual Activity   ? Alcohol use: Never   ? Drug use: Never   ? Sexual activity: Yes     Partners: Male     Birth control/protection: Surgical                   PHYSICAL EXAM:    Vitals:    02/08/22 1328   BP: 132/84   BP Source: Arm, Left Upper   Temp: 36.5 ?C (97.7 ?F)   SpO2: 99%   O2 Device: None (Room air)   Weight: 125.9 kg (277 lb 9 oz)   Height: 177.8 cm (5' 10)           Body mass index is 39.83 kg/m?Marland Kitchen  General: Alert, cooperative, no acute distress.  HEENT: Normocephalic, atraumatic.  Neck: Supple.  Lungs: Unlabored respirations, bilateral and equal chest excursion.  Heart: Regular rate.  Skin: Warm and dry to touch.  Abdomen: Nondistended.    Cervical spine:  Cervical tenderness: Yes, increased muscle tone in paraspinals and upper trapezius  Pain with extension: Yes  Pain with lateral flexion: Bilateral  Limited neck ROM: No  Sensation to light touch - left upper extremity: intact  Sensation to light touch - right upper extremity: intact  Hoffman sign: Negative bilaterally    Upper extremity strength:   Root Right Left   Shoulder Abduction C5 5 5   Elbow Flexion C5 5 5   Elbow Extension C7 5 5   Wrist Extension C6 5 5   Finger Flexion C8 5 5   Finger Abduction T1 5 5     Upper extremity reflexes:   Right Left   Biceps 2/4 2/4   Triceps 2/4 2/4   Brachioradialis 2/4 2/4       Lumbar spine:  Appearance: No lesions or deformity  Lumbar tenderness: Yes   SI joint tenderness: Positive bilaterally  Pain with extension: Yes  Pain with lateral flexion: None  Sensation to light touch - left lower extremity: intact  Sensation to light touch - right lower extremity: intact  Straight leg raise: Negative bilaterally  FABER test: Positive bilaterally  FADIR test: Negative bilaterally  Lateral pelvic compression: Reproduces bilateral SI joint pain  Thigh thrust: Reproduces bilateral SI joint pain  Gaenslen test: Deferred    Lower extremity strength:   Root Right Left   Hip Flexion L2 5 5   Knee Flexion L5/S1 5 5   Knee Extension L3 5 5   Dorsiflexion L4 5 5   Plantarflexion S1 5 5   EHL Extension L5 5 5     Lower extremity reflexes:   Right Left   Patellar 2/4 2/4   Achilles 2/4 2/4       Neurological: Alert and oriented x3.      RADIOGRAPHIC EVALUATION:    MRI L spine 07/20/21  1. Mild lumbar spondylosis. Facet arthropathy and ligamentum flavum   hypertrophy at multiple levels. No acute osseous abnormalities   demonstrated.     2. At the L4/L5 level, there is a concentric disc bulge with a tiny focal annular tear which effaces the anterior thecal sac at this level.   There is facet arthropathy ligamentum flavum hypertrophy at this level.   There is at least mild or mild-to-moderate central canal stenosis at   this level.     3. There is no significant neural foraminal narrowing at?any level.     MRI C spine 07/20/21  Subtle linear signal changes within the cord at C6 most suggestive of a   subtle syrinx. The remainder the cervical spine is unremarkable without   disc protrusion or stenosis.       IMPRESSION:    1. Sacroiliitis (HCC)          PLAN:   Bilateral sacral lateral branch RFA

## 2022-02-08 NOTE — Procedures
Attending Surgeon: Philomena Course, MD    Anesthesia: Local    Pre-Procedure Diagnosis:   1. Sacroiliitis (HCC)        Post-Procedure Diagnosis:   1. Sacroiliitis (HCC)             DESTRUCTION OF OTHER PERIPHERAL NERVE OR BRANCH FLUORO    Laterality: bilateral    Location: Other (Comment) (sacral lateral branches)       Consent:   Consent obtained: written  Consent given by: patient    Discussed with patient the purpose of the treatment/procedure, other ways of treating my condition, including no treatment/ procedure and the risks and benefits of the alternatives. Patient has decided to proceed with treatment/procedure.        Universal Protocol:  Relevant documents: relevant documents present and verified  Test results: test results available and properly labeled  Imaging studies: imaging studies available  Required items: required blood products, implants, devices, and special equipment available  Site marked: the operative site was marked  Patient identity confirmed: Patient identify confirmed verbally with patient.        Time out: Immediately prior to procedure a time out was called to verify the correct patient, procedure, equipment, support staff and site/side marked as required      Procedures Details:   Indications: pain     Prep: chlorhexidine    Sedation: anxiolysis  Sedation Medications: Versed 2 mg and Fentanyl 100 mcg  Patient position: supine  Estimated Blood Loss: minimal  Specimens: none      Guidance: fluoroscopy  Needle size: 20 G    Neurolytic Technique: Radiofrequency Ablation    Motor Testing: Motor testing complete per protocol   Injection procedure: Negative aspiration for blood and Incremental injection  Patient tolerance: Patient tolerated the procedure well with no immediate complications. Pressure was applied, and hemostasis was accomplished.  Comments:                   ANESTHESIA PROCEDURE REPORT    Radiofrequency Ablation (RFA) of Sacral Lateral Branch Nerves    Date of Service: 02/08/22    Procedure Title(s):    1. Radiofrequency ablation of the bilateral S1-3 lateral branch nerves   2. Intraoperative fluoroscopy    Attending Surgeon: Philomena Course, MD    Anesthesia: Local          Conscious Sedation Yes    Procedure in Detail: IV was started? Yes    The patient was brought into the procedure room and placed in the prone position on the fluoroscopy table. Standard monitors were placed, and vital signs were observed throughout the procedure. The area of the lumbar spine and upper buttocks were prepped with chlorhexadine and draped in a sterile manner. AP fluoroscopy was used to identify and mark the mid point between the sacral spinous processes and the S1, S2, and S3 foramina on the right. The skin and subcutaneous tissues in these identified areas were anesthetized with 1% lidocaine. A 20-gauge, 5 inch, 10 mm active tip radiofrequency probe was advanced toward each of these points under fluoroscopic guidance using a superior to inferior angle of approach. Once bone was contacted, negative aspiration was confirmed. Motor stimulation at 2 Hz and 2 volts was negative. 2mL of lidocaine 1% was injected prior to lesioning, which was performed for 90 seconds at 80 degrees centigrade.     The same procedure was then performed on the opposite side: Yes.    The probes were removed with a  1% lidocaine flush. The patient's back was cleaned, and bandages were placed at the needle insertion sites.    Disposition: The patient tolerated the procedure well, and there were no apparent complications. Vital signs remained stable throughout the procedure. The patient was taken to the recovery area where discharge instructions for the procedure were given.    Estimated Blood Loss: Minimal    Specimens: None    Complications: None              Radiofrequency Temperature 80          Administrations This Visit     fentaNYL citrate PF (SUBLIMAZE) injection 50-100 mcg     Admin Date  02/08/2022 Action  Given Dose  100 mcg Route  Intravenous Administered By  Carie Caddy, RN          lidocaine PF 1% (10 mg/mL) injection 15 mL     Admin Date  02/08/2022 Action  Given Dose  15 mL Route  Injection Administered By  Carie Caddy, RN          midazolam (VERSED) injection 1-2 mg     Admin Date  02/08/2022 Action  Given Dose  2 mg Route  Intravenous Administered By  Carie Caddy, RN              Estimated blood loss: none or minimal  Specimens: none  Patient tolerated the procedure well with no immediate complications. Pressure was applied, and hemostasis was accomplished.

## 2022-03-13 ENCOUNTER — Encounter: Admit: 2022-03-13 | Discharge: 2022-03-13 | Payer: BC Managed Care – PPO

## 2022-04-18 IMAGING — MR SPLUMBWO
9 of 12 series · 28 of 48 positions shown · non-contrast
Comparison: none

[Series 5: T2 · sagittal · 4.0mm · 0.68mm/px · 2 of 17 slices shown (1 of 4)]
[im 1/17]
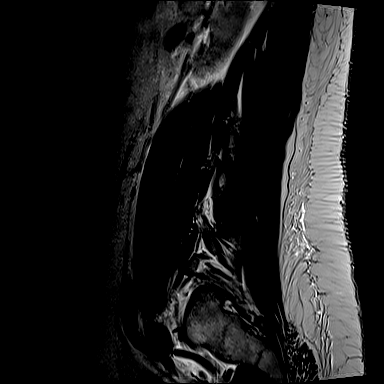
[im 17/17]
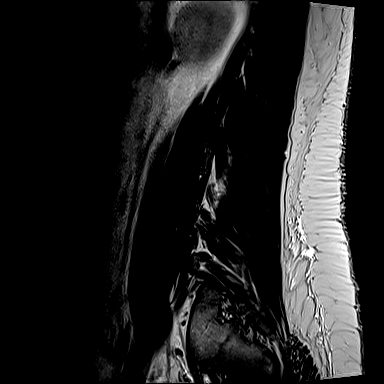

[Series 6: T1 · sagittal · 4.0mm · 0.81mm/px · 2 of 17 slices shown (1 of 4)]
[im 1/17]
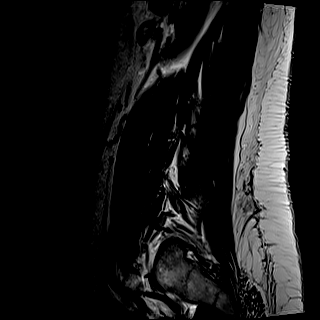
[im 17/17]
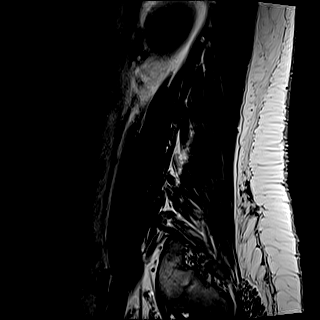

[Series 7: STIR · sagittal · 4.0mm · 0.51mm/px · 2 of 17 slices shown]
[im 1/17]
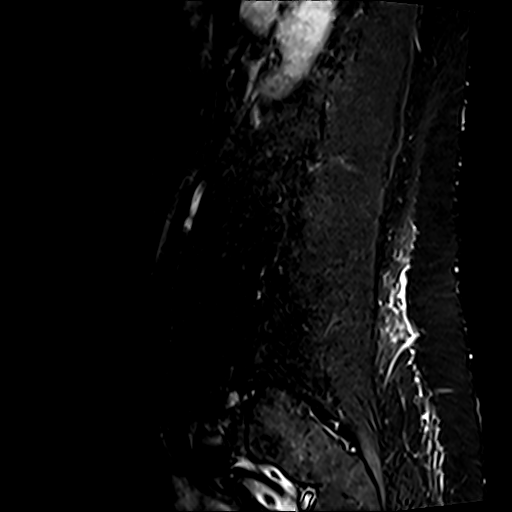
[im 17/17]
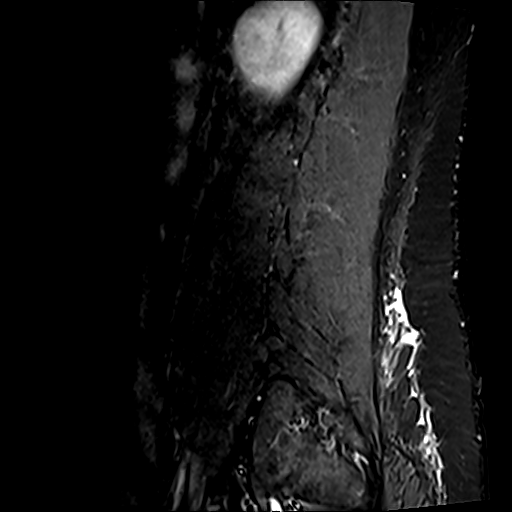

[Series 8: T2 · axial · 4.5mm · 0.81mm/px · z∈[-70,+95]mm · 4 of 32 slices shown (2 of 4)]
[im 1/32]
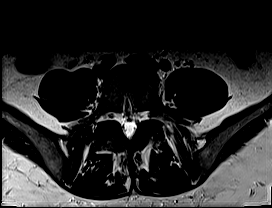
[im 11/32]
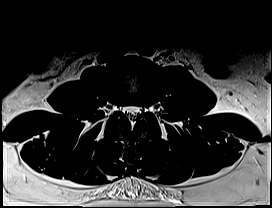
[im 21/32]
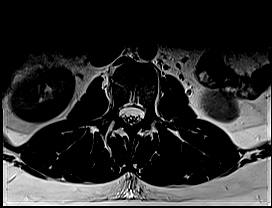
[im 32/32]
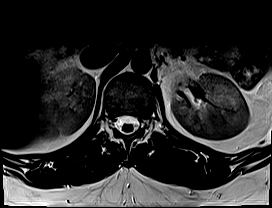

[Series 9: T2 · axial · 4.5mm · 0.81mm/px · 1 of 8 slices shown (3 of 4)]
[im 1/8]
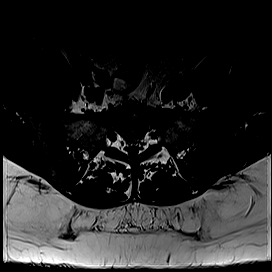

[Series 10: T2 · axial · 4.5mm · 0.81mm/px · z∈[-168,+95]mm · 6 of 40 slices shown (4 of 4)]
[im 1/40]
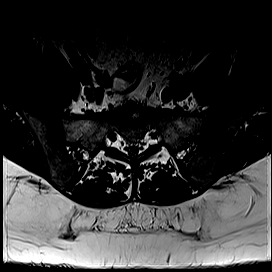
[im 8/40]
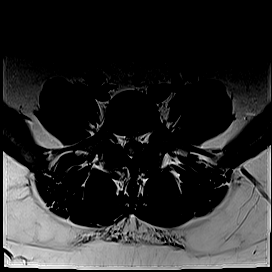
[im 16/40]
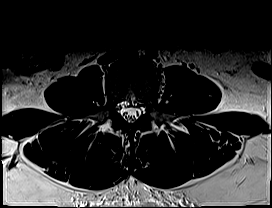
[im 24/40]
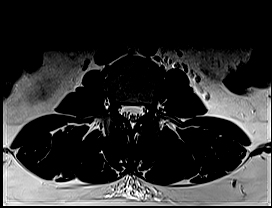
[im 32/40]
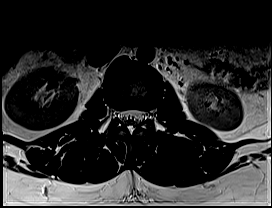
[im 40/40]
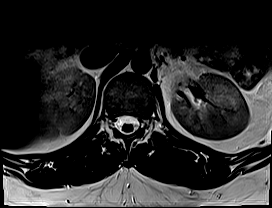

[Series 11: T1 · axial · 4.5mm · 0.43mm/px · z∈[-71,+95]mm · 4 of 32 slices shown (2 of 4)]
[im 1/32]
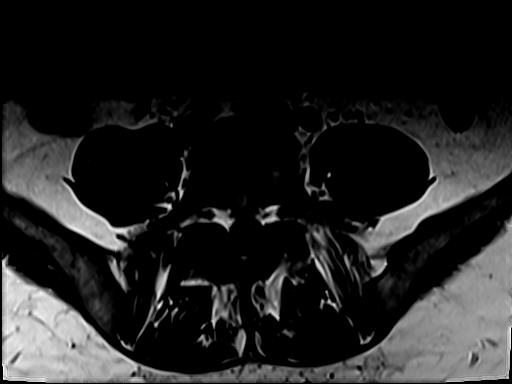
[im 11/32]
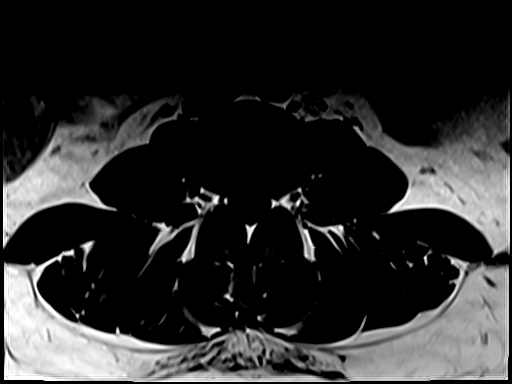
[im 21/32]
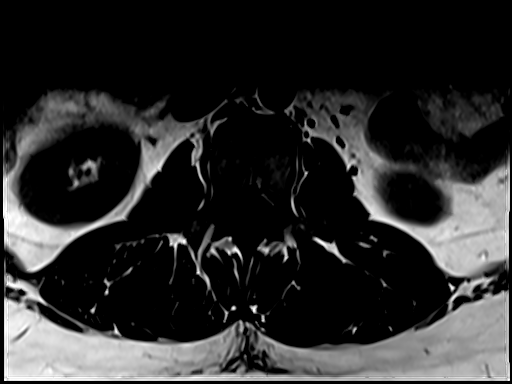
[im 32/32]
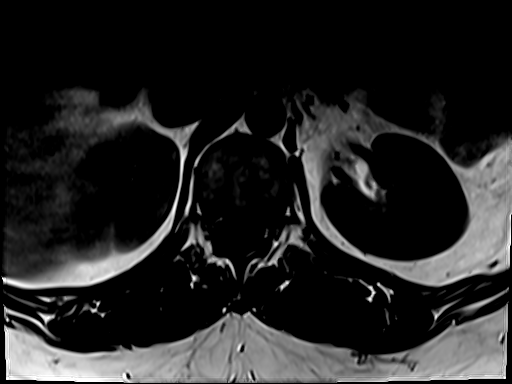

[Series 12: T1 · axial · 4.5mm · 0.43mm/px · 1 of 8 slices shown (3 of 4)]
[im 1/8]
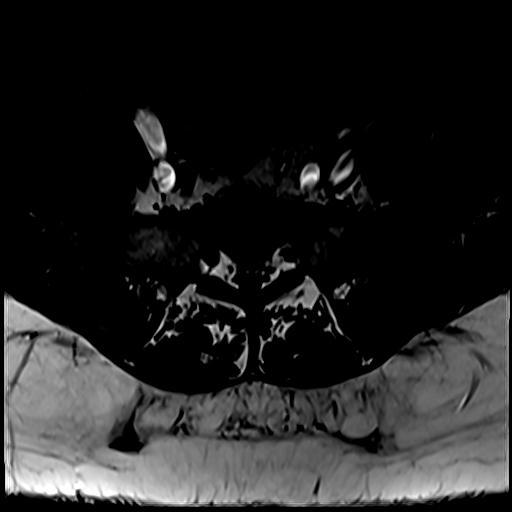

[Series 13: T1 · axial · 4.5mm · 0.43mm/px · z∈[-168,+95]mm · 6 of 40 slices shown (4 of 4)]
[im 1/40]
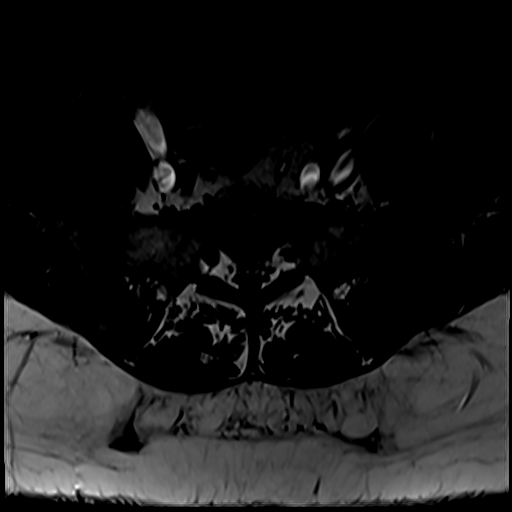
[im 8/40]
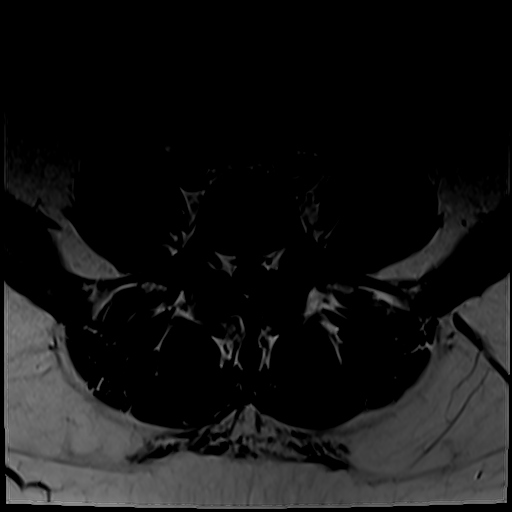
[im 16/40]
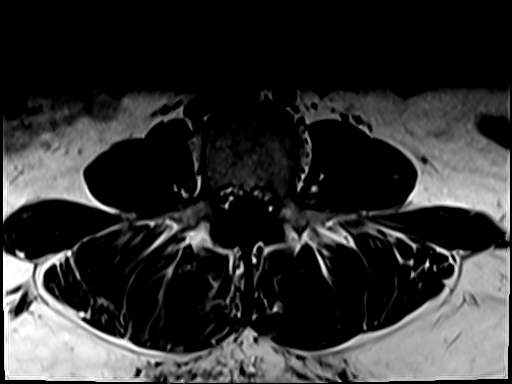
[im 24/40]
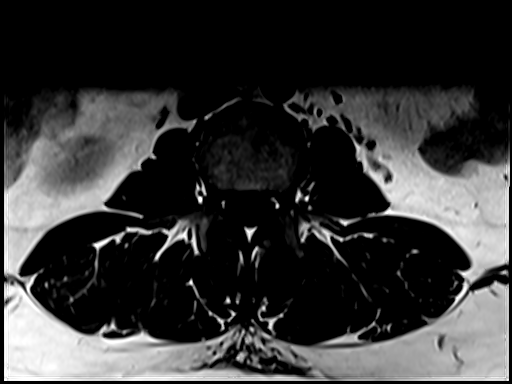
[im 32/40]
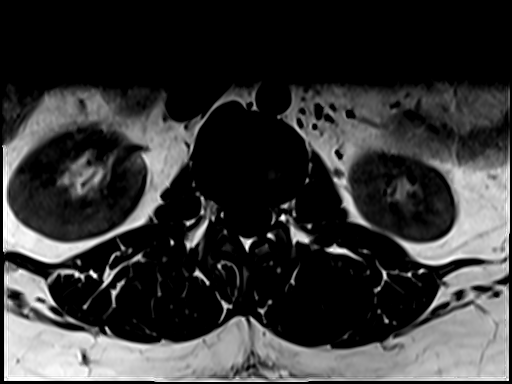
[im 40/40]
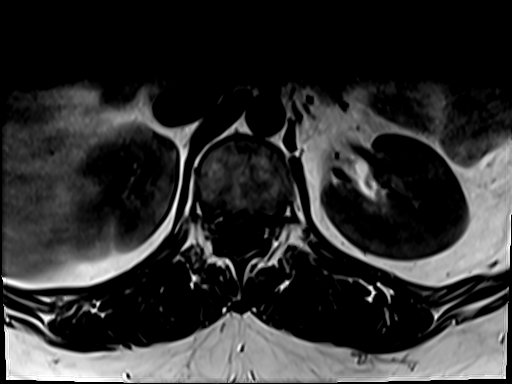

[28 of 48 positions shown; findings below may reference images not displayed]

DIAGNOSTIC STUDIES

EXAM

MAGNETIC RESONANCE IMAGING, SPINAL CANAL AND CONTENTS, LUMBAR; WITHOUT CONTRAST MATERIAL CPT 46437

INDICATION

Low back pain.

TECHNIQUE

Routine imaging sequences were obtained on a GE system.

COMPARISONS

No priors available for comparison.

FINDINGS

Minimal convexity to the left. Normal vertebral height. The marrow signal is slightly heterogeneous
but is likely within normal limits. Small hemangiomas. No significant marrow edema. No acute
displaced fracture or dislocation. The conus is located at the level of L2.

L1-L2: No significant disc bulge or protrusion.

L2-L3: No significant disc bulge or protrusion.

L3-L4: No significant disc bulge or protrusion.

L4-L5: Mild diffuse disc bulge with mild ligamentum flava hypertrophy and moderate facet
arthropathy. Moderate central canal stenosis. No neural foramen stenosis.

L5-S1: Minimal diffuse disc bulge with a moderate degree of ligamentum flava hypertrophy and facet
arthropathy. Mild central canal stenosis. No neural foramen stenosis.

No enlarged retroperitoneal nodes. No paraspinal fluid collections. Paravertebral soft tissues are
not remarkable.

IMPRESSION

1. Mild to moderate degenerative changes within the lower lumbar spine, as described above.

Tech Notes:

LOW BACK PAIN INCREASING PAIN X 3 WEEKS, WEAKNESS TO LEGS WITH BENDING OVER, PAIN WITH LAYING ON
BACK.  RG

## 2022-05-09 ENCOUNTER — Ambulatory Visit: Admit: 2022-05-09 | Discharge: 2022-05-10 | Payer: BC Managed Care – PPO

## 2022-05-09 ENCOUNTER — Encounter: Admit: 2022-05-09 | Discharge: 2022-05-09 | Payer: BC Managed Care – PPO

## 2022-05-09 DIAGNOSIS — M5416 Radiculopathy, lumbar region: Secondary | ICD-10-CM

## 2022-05-09 DIAGNOSIS — F32A Depression: Secondary | ICD-10-CM

## 2022-05-09 DIAGNOSIS — F419 Anxiety disorder, unspecified: Secondary | ICD-10-CM

## 2022-05-09 DIAGNOSIS — M5136 Other intervertebral disc degeneration, lumbar region: Secondary | ICD-10-CM

## 2022-05-09 DIAGNOSIS — M2550 Pain in unspecified joint: Secondary | ICD-10-CM

## 2022-05-09 DIAGNOSIS — M5030 Other cervical disc degeneration, unspecified cervical region: Secondary | ICD-10-CM

## 2022-05-09 DIAGNOSIS — R112 Nausea with vomiting, unspecified: Secondary | ICD-10-CM

## 2022-05-09 DIAGNOSIS — R519 Generalized headaches: Secondary | ICD-10-CM

## 2022-05-09 DIAGNOSIS — M461 Sacroiliitis, not elsewhere classified: Secondary | ICD-10-CM

## 2022-05-09 DIAGNOSIS — D539 Nutritional anemia, unspecified: Secondary | ICD-10-CM

## 2022-05-09 MED ORDER — PREGABALIN 50 MG PO CAP
50 mg | ORAL_CAPSULE | Freq: Three times a day (TID) | ORAL | 5 refills | Status: AC
Start: 2022-05-09 — End: ?

## 2022-05-09 NOTE — Progress Notes
SPINE CENTER HISTORY AND PHYSICAL         HISTORY OF PRESENT ILLNESS:      Chief complaint: Pain of the Lower Back and Follow Up (Spinal pain /)    Patient seen and examined. No changes to below H&P from 01/10/22.    02/08/22  Sophia Wood presents via telehealth for follow-up of back pain.  She had bilateral SI joint ablation in May 2023.  She has noticed good relief of her lateral low back and sacral pain.  However, she is noticing pain in the middle of the lower lumbosacral region.  This is still constant, aching, and severe.  It is worse with higher levels activity, especially frequent bending such as with gardening.  She has also noticed occasional achiness in her legs with these activities.  She denies any numbness or tingling in the lower extremities.  She is not currently taking medications for pain.  As noted in her previous HPI.  She has tried several medications without much benefit.  She had lumbar radiofrequency ablation in the past which actually increased pain.  Steroid injections have caused severe body pain and she prefers to avoid anything like this.  She has continued home exercises for over 3 months.  She reports having new MRI of her lumbar spine on July 27 at [facility] in [place], [place].    01/10/22  Sophia Wood is a 36 y.o. female who  has a past medical history of Anxiety, Degenerative disc disease, cervical, Degenerative disc disease, lumbar, Depression (2008), Generalized headaches (1995), Joint pain, PONV (postoperative nausea and vomiting), and Unspecified deficiency anemia.  She primarily presents today for low back pain.  This started several years ago but became much worse in early 2021.  It has progressively worsened further since that time.  The pain is constant, aching, and sharp.  The pain is moderate to severe.  It does not radiate to the lower extremities.  She denies any numbness, tingling, or weakness.  She denies any particular injury that caused this pain.  She has tried physical therapy on several occasions with ongoing home exercises which have not provided much relief.  She has tried ibuprofen, diclofenac, gabapentin, Flexeril, and tizanidine without much benefit.  She has seen a couple of pain physicians.  She has severe body pain with steroid injections, that she has never had any steroid injections for her low back problems.  She had lumbar medial branch blocks which helped temporarily, but she actually felt worse after her lumbar RFA.  The most effective procedure thus far has been bilateral SI joint injections of ropivacaine only.  This was in January 2023.  She reported 80% relief lasting about 45 minutes.  She did have some numbness temporarily in her right leg after the injection.  She has never had any surgery on her lower back.  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    Her secondary complaint is neck pain.  This has been present since before age 47.  She believes it started on around the time that she dove into a lake and hit her head.  She is also tried physical therapy several times for this.  She has rarely noticed radiation of pain into the right shoulder and the arm, but this is mostly just in her bilateral neck.  The pain is constant and aching.  She has trouble getting comfortable.  She sometimes has headaches when her neck pain is more severe.  Patient-entered data is noted with quotes.    Pain location: (P) Lower back  Pain radiation:   Does the pain move into your arm or leg?: (P) Yes  Explanation of how far pain moves:: (P) Occasionally moves into leg, could be right or left  Pain start date: (P) Greater than 1 year     Inciting event: (P) No       Description of pain: (P) Aching, Tiring, Nagging, Burning, Sharp    Numbness/tingling/weakness: (P) None      Bladder or bowel retention or incontinence?: No  Exacerbating factors: (P) Stand, Walk      Alleviating factors: (P) Sitting down Imaging:   Have you had an X-ray for this condition?: (P) Yes  Have you had an MRI for this condition?: (P) Yes  Physical therapy/home exercise:   Have you had physical therapy for this condition?: (P) Yes  What was the approximate date physical therapy was started?: (P) 11/22/19  How long did you do physical therapy? (in weeks): (P) 26  Anticoagulants:   Are you taking one of the following blood thinners?: (P) No      Pain diagram:               Medical History:   Diagnosis Date   ? Anxiety    ? Degenerative disc disease, cervical    ? Degenerative disc disease, lumbar    ? Depression 2008    Remission   ? Generalized headaches 1995   ? Joint pain    ? PONV (postoperative nausea and vomiting)    ? Unspecified deficiency anemia          Surgical History:   Procedure Laterality Date   ? ABDOMEN SURGERY     ? HX ARTHROSCOPIC SURGERY      B) knees   ? HX HYSTERECTOMY  2021   ? HX TONSILLECTOMY  2022   ? KNEE SURGERY  2012, 2020         Allergies   Allergen Reactions   ? Flonase [Fluticasone] RASH   ? Corticosteroids (Glucocorticoids) SEE COMMENTS     Joint pain   ? Latex ITCHING         Current Outpatient Medications on File Prior to Visit   Medication Sig Dispense Refill   ? calcium carbonate (CALCIUM 500) 500 mg calcium (1,250 mg) chewable tablet      ? CHOLEcalciferoL (vitamin D3) (VITAMIN D3) 5000 unit tablet      ? cyclobenzaprine (FLEXERIL) 5 mg tablet as Needed.     ? desvenlafaxine (KHEDEZLA) 100 mg ER tablet      ? desvenlafaxine succinate (PRISTIQ) 100 mg tablet Take one tablet by mouth every morning.     ? hydrOXYzine pamoate (VISTARIL) 25 mg capsule TAKE 1-2 CAPSULES BY MOUTH AT BEDTIME, AS NEEDED FOR SLEEP     ? LORazepam (ATIVAN) 1 mg tablet Take one tablet by mouth every 6 hours as needed.     ? MAGNESIUM GLUCONATE (BULK) MISC      ? metFORMIN (GLUCOPHAGE) 500 mg tablet Take one tablet by mouth every evening.     ? methocarbamoL (ROBAXIN) 500 mg tablet Take one tablet by mouth three times daily as needed (Pain). 90 tablet 1   ? multivit with iron,minerals (MULTIVITAMIN AND MINERALS PO)      ? vitamin E 400 unit capsule        Current Facility-Administered Medications on File Prior to Visit   Medication Dose Route Frequency Provider  Last Rate Last Admin   ? fentaNYL citrate PF (SUBLIMAZE) injection 50-100 mcg  50-100 mcg Intravenous Q2 MIN PRN Philomena Course, MD   100 mcg at 02/08/22 1358   ? lidocaine PF 1% (10 mg/mL) injection 0.2 mL  0.2 mL Injection PRN Philomena Course, MD       ? midazolam (VERSED) injection 1-2 mg  1-2 mg Intravenous Q2 MIN PRN Philomena Course, MD   2 mg at 02/08/22 1357         family history includes Arthritis in her maternal grandmother and mother; Back pain in her maternal grandfather and mother; Hypertension in her father and paternal grandmother; Joint Pain in her maternal grandmother and mother; Neck Pain in her mother; Stroke in her mother and paternal grandmother.      Social History     Socioeconomic History   ? Marital status: Married   Tobacco Use   ? Smoking status: Never   ? Smokeless tobacco: Never   Substance and Sexual Activity   ? Alcohol use: Never   ? Drug use: Never   ? Sexual activity: Yes     Partners: Male     Birth control/protection: Surgical                   PHYSICAL EXAM:    Vitals:    05/09/22 1607   PainSc: Five   Weight: 127 kg (280 lb)   Height: 177.8 cm (5' 10)     Oswestry Total Score:: 44  Pain Score: Five  Body mass index is 40.18 kg/m?Marland Kitchen    Exam limited due to telehealth    General: Alert, cooperative, no acute distress.  HEENT: Normocephalic, atraumatic.  Neck: Supple.  Lungs: Unlabored respirations, no audible wheezing  Skin: No cyanosis.  MSK: Spontaneous movement of bilateral upper extremities.  Neurological: Alert and oriented x3.        RADIOGRAPHIC EVALUATION:    MRI L spine 07/20/21  1. Mild lumbar spondylosis. Facet arthropathy and ligamentum flavum   hypertrophy at multiple levels. No acute osseous abnormalities   demonstrated. 2. At the L4/L5 level, there is a concentric disc bulge with a tiny   focal annular tear which effaces the anterior thecal sac at this level.   There is facet arthropathy ligamentum flavum hypertrophy at this level.   There is at least mild or mild-to-moderate central canal stenosis at   this level.     3. There is no significant neural foraminal narrowing at?any level.     MRI C spine 07/20/21  Subtle linear signal changes within the cord at C6 most suggestive of a   subtle syrinx. The remainder the cervical spine is unremarkable without   disc protrusion or stenosis.         IMPRESSION:    1. Lumbar radiculopathy    2. Lumbar degenerative disc disease    3. Sacroiliitis [facility]          PLAN:   Sophia Wood has ongoing pain of the lower back with some aching in her legs.  Her lateral sacral pain has done much better with ablation, so I think her current source of pain is different than sacroiliitis.  I am suspicious for lumbar stenosis.  She thinks this may have been a finding on her MRI, but I do not have any records to review today.  I will request her recent MRI of the lumbar spine from [facility] in [place], [place].  If  she indeed has stenosis, treatment will be somewhat challenging since she cannot tolerate steroid injections.  For now, I we will start pregabalin 50 mg 3 times daily for neuropathic pain.  I will let her know if any other options are available after I review her MRI.    Total face-to-face time during this visit was 16 minutes, and total time spent (including documentation and record review) on the day of the encounter was 22 minutes. The quality of the connection was good with no interruptions.

## 2022-05-09 NOTE — Patient Instructions
It was nice to see you today. Thank you for choosing to visit our clinic.       Your time is important and if you had to wait today, we do apologize. Our goal is to run exactly on time; however, on occasion, we get behind in clinic due to unexpected patient issues. Thank you for your patience.    General Instructions:  How to reach me: Please send a MyChart message to the Spine Center or leave a voicemail for my nurse, George at [phone]  How to get a medication refill: Please use the MyChart Refill request or contact your pharmacy directly to request medication refills. We do not do same day refills on controlled substances.  How to receive your test results: If you have signed up for MyChart, you will receive your test results and messages from me this way. Otherwise, you will get a phone call or letter. If you are expecting results and have not heard from my office within 2 weeks of your testing, please send a MyChart message or call my office.  Scheduling: Our scheduling phone number is [phone].  Appointment Reminders on your cell phone: Communication preferences can be managed in MyChart to ensure you receive important appointment notifications.  Support for many chronic illnesses is available through [facility]: [email or web address] or [phone].  For questions on nights, weekends or holidays, call the Operator at [phone], and ask for the doctor on call for Anesthesia Pain.      Again, thank you for coming in today.    _________________________________________________________________________________________________________

## 2022-05-15 ENCOUNTER — Encounter: Admit: 2022-05-15 | Discharge: 2022-05-15 | Payer: BC Managed Care – PPO

## 2022-05-16 ENCOUNTER — Encounter: Admit: 2022-05-16 | Discharge: 2022-05-16 | Payer: BC Managed Care – PPO

## 2022-06-28 ENCOUNTER — Encounter: Admit: 2022-06-28 | Discharge: 2022-06-28 | Payer: BC Managed Care – PPO

## 2022-11-26 ENCOUNTER — Encounter: Admit: 2022-11-26 | Discharge: 2022-11-26 | Payer: BC Managed Care – PPO

## 2022-11-26 ENCOUNTER — Ambulatory Visit: Admit: 2022-11-26 | Discharge: 2022-11-26 | Payer: BC Managed Care – PPO

## 2022-11-26 DIAGNOSIS — R519 Generalized headaches: Secondary | ICD-10-CM

## 2022-11-26 DIAGNOSIS — R112 Nausea with vomiting, unspecified: Secondary | ICD-10-CM

## 2022-11-26 DIAGNOSIS — D539 Nutritional anemia, unspecified: Secondary | ICD-10-CM

## 2022-11-26 DIAGNOSIS — F32A Depression: Secondary | ICD-10-CM

## 2022-11-26 DIAGNOSIS — M2550 Pain in unspecified joint: Secondary | ICD-10-CM

## 2022-11-26 DIAGNOSIS — M5136 Other intervertebral disc degeneration, lumbar region: Secondary | ICD-10-CM

## 2022-11-26 DIAGNOSIS — F419 Anxiety disorder, unspecified: Secondary | ICD-10-CM

## 2022-11-26 DIAGNOSIS — M5030 Other cervical disc degeneration, unspecified cervical region: Secondary | ICD-10-CM

## 2022-11-26 DIAGNOSIS — M461 Sacroiliitis, not elsewhere classified: Secondary | ICD-10-CM

## 2022-11-26 NOTE — Progress Notes
SPINE CENTER HISTORY AND PHYSICAL         HISTORY OF PRESENT ILLNESS:      Chief complaint: Pain of the Lower Back (Pain when walk ) and Follow Up (Si Joint pain )    11/26/22  Sophia Wood she presents for follow-up of her bilateral sacroiliac joint pain.  I last saw her in May 2023 for bilateral radiofrequency ablation of the S1-S3 lateral branches.  At first she did not notice much benefit, but by August 2023 she noticed about 75% pain relief that lasted through the end of the year.  Her pain has been gradually returning over the past couple of months.  It is constant, aching, and severe with activity.  It is worse when she sits for long periods of time, such as long car rides.  The pain can sometimes radiate to the bilateral posterior thighs.  There is no numbness, tingling, or weakness.  Both sides are approximately equal in pain intensity.  She has not had any other treatment for the SI joint since her last visit.  She is interested in repeating the ablation.  I had also recommended Lyrica in the past, but her psychiatrist recommend against it so she has not taken it.        02/08/22  Sophia Wood presents via telehealth for follow-up of back pain.  She had bilateral SI joint ablation in May 2023.  She has noticed good relief of her lateral low back and sacral pain.  However, she is noticing pain in the middle of the lower lumbosacral region.  This is still constant, aching, and severe.  It is worse with higher levels activity, especially frequent bending such as with gardening.  She has also noticed occasional achiness in her legs with these activities.  She denies any numbness or tingling in the lower extremities.  She is not currently taking medications for pain.  As noted in her previous HPI.  She has tried several medications without much benefit.  She had lumbar radiofrequency ablation in the past which actually increased pain.  Steroid injections have caused severe body pain and she prefers to avoid anything like this.  She has continued home exercises for over 3 months.  She reports having new MRI of her lumbar spine on July 27 at [facility] in [place], [place].    01/10/22  Sophia Wood is a 37 y.o. female who  has a past medical history of Anxiety, Degenerative disc disease, cervical, Degenerative disc disease, lumbar, Depression (2008), Generalized headaches (1995), Joint pain, PONV (postoperative nausea and vomiting), and Unspecified deficiency anemia.  She primarily presents today for low back pain.  This started several years ago but became much worse in early 2021.  It has progressively worsened further since that time.  The pain is constant, aching, and sharp.  The pain is moderate to severe.  It does not radiate to the lower extremities.  She denies any numbness, tingling, or weakness.  She denies any particular injury that caused this pain.  She has tried physical therapy on several occasions with ongoing home exercises which have not provided much relief.  She has tried ibuprofen, diclofenac, gabapentin, Flexeril, and tizanidine without much benefit.  She has seen a couple of pain physicians.  She has severe body pain with steroid injections, that she has never had any steroid injections for her low back problems.  She had lumbar medial branch blocks which helped temporarily, but she actually felt worse after her  lumbar RFA.  The most effective procedure thus far has been bilateral SI joint injections of ropivacaine only.  This was in January 2023.  She reported 80% relief lasting about 45 minutes.  She did have some numbness temporarily in her right leg after the injection.  She has never had any surgery on her lower back.  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    Her secondary complaint is neck pain.  This has been present since before age 63.  She believes it started on around the time that she dove into a lake and hit her head.  She is also tried physical therapy several times for this.  She has rarely noticed radiation of pain into the right shoulder and the arm, but this is mostly just in her bilateral neck.  The pain is constant and aching.  She has trouble getting comfortable.  She sometimes has headaches when her neck pain is more severe.      Patient-entered data is noted with quotes.    Pain location: (P) Lower back  Pain radiation:   Does the pain move into your arm or leg?: (P) Yes  Explanation of how far pain moves:: (P) Occasionally moves into leg, could be right or left  Pain start date: (P) Greater than 1 year     Inciting event: (P) No       Description of pain: (P) Aching, Tiring, Nagging, Burning, Sharp    Numbness/tingling/weakness: (P) None      Bladder or bowel retention or incontinence?: No  Exacerbating factors: (P) Stand, Walk      Alleviating factors: (P) Sitting down      Imaging:   Have you had an X-ray for this condition?: (P) Yes  Have you had an MRI for this condition?: (P) Yes  Physical therapy/home exercise:   Have you had physical therapy for this condition?: (P) Yes  What was the approximate date physical therapy was started?: (P) 11/22/19  How long did you do physical therapy? (in weeks): (P) 26  Anticoagulants:   Are you taking one of the following blood thinners?: (P) No      Pain diagram:               Medical History:   Diagnosis Date    Anxiety     Degenerative disc disease, cervical     Degenerative disc disease, lumbar     Depression 2008    Remission    Generalized headaches 1995    Joint pain     PONV (postoperative nausea and vomiting)     Unspecified deficiency anemia          Surgical History:   Procedure Laterality Date    ABDOMEN SURGERY      HX ARTHROSCOPIC SURGERY      B) knees    HX HYSTERECTOMY  2021    HX TONSILLECTOMY  2022    KNEE SURGERY  2012, 2020         Allergies   Allergen Reactions    Flonase [Fluticasone] RASH    Corticosteroids (Glucocorticoids) SEE COMMENTS Joint pain    Latex ITCHING         Current Outpatient Medications on File Prior to Visit   Medication Sig Dispense Refill    calcium carbonate (CALCIUM 500) 500 mg calcium (1,250 mg) chewable tablet       CHOLEcalciferoL (vitamin D3) (VITAMIN D3) 5000 unit tablet  desvenlafaxine succinate (PRISTIQ) 100 mg tablet Take one tablet by mouth every morning.      LORazepam (ATIVAN) 1 mg tablet Take one tablet by mouth every 6 hours as needed.      multivit with iron,minerals (MULTIVITAMIN AND MINERALS PO)        Current Facility-Administered Medications on File Prior to Visit   Medication Dose Route Frequency Provider Last Rate Last Admin    fentaNYL citrate PF (SUBLIMAZE) injection 50-100 mcg  50-100 mcg Intravenous Q2 MIN PRN Philomena Course, MD   100 mcg at 02/08/22 1358    lidocaine PF 1% (10 mg/mL) injection 0.2 mL  0.2 mL Injection PRN Philomena Course, MD        midazolam (VERSED) injection 1-2 mg  1-2 mg Intravenous Q2 MIN PRN Philomena Course, MD   2 mg at 02/08/22 1357         family history includes Arthritis in her maternal grandmother and mother; Back pain in her maternal grandfather and mother; Hypertension in her father and paternal grandmother; Joint Pain in her maternal grandmother and mother; Neck Pain in her mother; Stroke in her mother and paternal grandmother.      Social History     Socioeconomic History    Marital status: Married   Tobacco Use    Smoking status: Never    Smokeless tobacco: Never   Substance and Sexual Activity    Alcohol use: Never    Drug use: Never    Sexual activity: Yes     Partners: Male     Birth control/protection: Surgical                   PHYSICAL EXAM:    Vitals:    11/26/22 1542   BP: (P) 121/79   Pulse: 89   SpO2: 98%   PainSc: Three   Weight: 105.7 kg (233 lb)   Height: 177.8 cm (5' 10)     Oswestry Total Score:: 50  Pain Score: Three  Body mass index is 33.43 kg/m?Marland Kitchen    General: Alert, cooperative, no acute distress.  HEENT: Normocephalic, atraumatic.  Neck: Supple.  Lungs: Unlabored respirations, bilateral and equal chest excursion.  Heart: Regular rate.  Skin: Warm and dry to touch.  Abdomen: Nondistended.    Lumbar spine:  Appearance: No lesions or deformity  Lumbar tenderness: No  SI joint tenderness: Positive bilaterally  Pain with extension: No  Pain with lateral flexion: None  Sensation to light touch - left lower extremity: intact  Sensation to light touch - right lower extremity: intact  Straight leg raise: Negative bilaterally  FABER test: Positive bilaterally  FADIR test: Negative bilaterally  Lateral pelvic compression: Reproduces bilateral SI joint pain  Thigh thrust: Reproduces bilateral SI joint pain  Gaenslen test: Deferred    Lower extremity strength:   Root Right Left   Hip Flexion L2 5 5   Knee Flexion L5/S1 5 5   Knee Extension L3 5 5   Dorsiflexion L4 5 5   Plantarflexion S1 5 5   EHL Extension L5 5 5     Lower extremity reflexes:   Right Left   Patellar 2/4 2/4   Achilles 2/4 2/4       Neurological: Alert and oriented x3.       RADIOGRAPHIC EVALUATION:    MRI L spine 04/18/22: Disc bulge at L4-L5 with annular tear.  Mild central and lateral recess stenosis bilaterally.  No significant change compared to  2022 MRI.        MRI C spine 07/20/21  Subtle linear signal changes within the cord at C6 most suggestive of a   subtle syrinx. The remainder the cervical spine is unremarkable without   disc protrusion or stenosis.         IMPRESSION:    1. Sacroiliitis (HCC)    2. Polyarthralgia            PLAN:   Sophia Wood has recurrent pain due to sacroiliitis.  She had substantial benefit with previous sacral lateral branch radiofrequency ablation.  She does not tolerate steroid injections.  She has tried home exercises for over 3 months as well as other appropriate conservative treatment.  I will schedule a repeat bilateral S1-S3 lateral branch radiofrequency ablation in the near future.

## 2022-11-26 NOTE — Patient Instructions
Scheduling line: [phone]

## 2022-11-30 ENCOUNTER — Encounter: Admit: 2022-11-30 | Discharge: 2022-11-30 | Payer: BC Managed Care – PPO

## 2022-12-11 ENCOUNTER — Encounter: Admit: 2022-12-11 | Discharge: 2022-12-11 | Payer: BC Managed Care – PPO

## 2022-12-18 ENCOUNTER — Encounter: Admit: 2022-12-18 | Discharge: 2022-12-18 | Payer: BC Managed Care – PPO

## 2022-12-19 ENCOUNTER — Encounter: Admit: 2022-12-19 | Discharge: 2022-12-19 | Payer: BC Managed Care – PPO

## 2022-12-19 ENCOUNTER — Ambulatory Visit: Admit: 2022-12-19 | Discharge: 2022-12-19 | Payer: BC Managed Care – PPO

## 2022-12-19 DIAGNOSIS — M461 Sacroiliitis, not elsewhere classified: Secondary | ICD-10-CM

## 2022-12-19 DIAGNOSIS — M2550 Pain in unspecified joint: Secondary | ICD-10-CM

## 2022-12-19 DIAGNOSIS — F419 Anxiety disorder, unspecified: Secondary | ICD-10-CM

## 2022-12-19 DIAGNOSIS — D539 Nutritional anemia, unspecified: Secondary | ICD-10-CM

## 2022-12-19 DIAGNOSIS — R519 Generalized headaches: Secondary | ICD-10-CM

## 2022-12-19 DIAGNOSIS — F32A Depression: Secondary | ICD-10-CM

## 2022-12-19 DIAGNOSIS — R112 Nausea with vomiting, unspecified: Secondary | ICD-10-CM

## 2022-12-19 DIAGNOSIS — M5136 Other intervertebral disc degeneration, lumbar region: Secondary | ICD-10-CM

## 2022-12-19 DIAGNOSIS — M5030 Other cervical disc degeneration, unspecified cervical region: Secondary | ICD-10-CM

## 2022-12-19 MED ORDER — FENTANYL CITRATE (PF) 50 MCG/ML IJ SOLN
50-100 ug | INTRAVENOUS | 0 refills | Status: AC | PRN
Start: 2022-12-19 — End: ?

## 2022-12-19 MED ORDER — MIDAZOLAM 1 MG/ML IJ SOLN
1-2 mg | INTRAVENOUS | 0 refills | Status: AC | PRN
Start: 2022-12-19 — End: ?

## 2022-12-19 MED ORDER — LIDOCAINE (PF) 10 MG/ML (1 %) IJ SOLN
15 mL | Freq: Once | INTRAMUSCULAR | 0 refills | Status: CP
Start: 2022-12-19 — End: ?

## 2022-12-19 NOTE — Progress Notes
[facility] HISTORY AND PHYSICAL         HISTORY OF PRESENT ILLNESS:      Chief complaint: No chief complaint on file.    12/19/22  Patient seen and examined. No changes to below H&P from 11/26/22.    11/26/22  Sophia Wood she presents for follow-up of her bilateral sacroiliac joint pain.  I last saw her in May 2023 for bilateral radiofrequency ablation of the S1-S3 lateral branches.  At first she did not notice much benefit, but by August 2023 she noticed about 75% pain relief that lasted through the end of the year.  Her pain has been gradually returning over the past couple of months.  It is constant, aching, and severe with activity.  It is worse when she sits for long periods of time, such as long car rides.  The pain can sometimes radiate to the bilateral posterior thighs.  There is no numbness, tingling, or weakness.  Both sides are approximately equal in pain intensity.  She has not had any other treatment for the SI joint since her last visit.  She is interested in repeating the ablation.  I had also recommended Lyrica in the past, but her psychiatrist recommend against it so she has not taken it.        02/08/22  Sophia Wood presents via telehealth for follow-up of back pain.  She had bilateral SI joint ablation in May 2023.  She has noticed good relief of her lateral low back and sacral pain.  However, she is noticing pain in the middle of the lower lumbosacral region.  This is still constant, aching, and severe.  It is worse with higher levels activity, especially frequent bending such as with gardening.  She has also noticed occasional achiness in her legs with these activities.  She denies any numbness or tingling in the lower extremities.  She is not currently taking medications for pain.  As noted in her previous HPI.  She has tried several medications without much benefit.  She had lumbar radiofrequency ablation in the past which actually increased pain.  Steroid injections have caused severe body pain and she prefers to avoid anything like this.  She has continued home exercises for over 3 months.  She reports having new MRI of her lumbar spine on July 27 at [facility] in [place], [place].    01/10/22  Sophia Wood is a 37 y.o. female who  has a past medical history of Anxiety, Degenerative disc disease, cervical, Degenerative disc disease, lumbar, Depression (2008), Generalized headaches (1995), Joint pain, PONV (postoperative nausea and vomiting), and Unspecified deficiency anemia.  She primarily presents today for low back pain.  This started several years ago but became much worse in early 2021.  It has progressively worsened further since that time.  The pain is constant, aching, and sharp.  The pain is moderate to severe.  It does not radiate to the lower extremities.  She denies any numbness, tingling, or weakness.  She denies any particular injury that caused this pain.  She has tried physical therapy on several occasions with ongoing home exercises which have not provided much relief.  She has tried ibuprofen, diclofenac, gabapentin, Flexeril, and tizanidine without much benefit.  She has seen a couple of pain physicians.  She has severe body pain with steroid injections, that she has never had any steroid injections for her low back problems.  She had lumbar medial branch blocks which helped temporarily, but she  actually felt worse after her lumbar RFA.  The most effective procedure thus far has been bilateral SI joint injections of ropivacaine only.  This was in January 2023.  She reported 80% relief lasting about 45 minutes.  She did have some numbness temporarily in her right leg after the injection.  She has never had any surgery on her lower back.  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.    Her secondary complaint is neck pain.  This has been present since before age 71.  She believes it started on around the time that she dove into a lake and hit her head.  She is also tried physical therapy several times for this.  She has rarely noticed radiation of pain into the right shoulder and the arm, but this is mostly just in her bilateral neck.  The pain is constant and aching.  She has trouble getting comfortable.  She sometimes has headaches when her neck pain is more severe.      Patient-entered data is noted with quotes.    Pain location: (P) Lower back  Pain radiation:   Does the pain move into your arm or leg?: (P) Yes  Explanation of how far pain moves:: (P) Occasionally moves into leg, could be right or left  Pain start date: (P) Greater than 1 year     Inciting event: (P) No       Description of pain: (P) Aching, Tiring, Nagging, Burning, Sharp    Numbness/tingling/weakness: (P) None      Bladder or bowel retention or incontinence?: No  Exacerbating factors: (P) Stand, Walk      Alleviating factors: (P) Sitting down      Imaging:   Have you had an X-ray for this condition?: (P) Yes  Have you had an MRI for this condition?: (P) Yes  Physical therapy/home exercise:   Have you had physical therapy for this condition?: (P) Yes  What was the approximate date physical therapy was started?: (P) 11/22/19  How long did you do physical therapy? (in weeks): (P) 26  Anticoagulants:   Are you taking one of the following blood thinners?: (P) No      Pain diagram:               Medical History:   Diagnosis Date    Anxiety     Degenerative disc disease, cervical     Degenerative disc disease, lumbar     Depression 2008    Remission    Generalized headaches 1995    Joint pain     PONV (postoperative nausea and vomiting)     Unspecified deficiency anemia          Surgical History:   Procedure Laterality Date    ABDOMEN SURGERY      HX ARTHROSCOPIC SURGERY      B) knees    HX HYSTERECTOMY  2021    HX TONSILLECTOMY  2022    KNEE SURGERY  2012, 2020         Allergies   Allergen Reactions    Flonase [Fluticasone] RASH    Corticosteroids (Glucocorticoids) SEE COMMENTS     Joint pain    Latex ITCHING         Current Outpatient Medications on File Prior to Encounter   Medication Sig Dispense Refill    calcium carbonate (CALCIUM 500) 500 mg calcium (1,250 mg) chewable tablet       CHOLEcalciferoL (vitamin D3) (VITAMIN  D3) 5000 unit tablet       desvenlafaxine succinate (PRISTIQ) 100 mg tablet Take one tablet by mouth every morning.      LORazepam (ATIVAN) 1 mg tablet Take one tablet by mouth every 6 hours as needed.      multivit with iron,minerals (MULTIVITAMIN AND MINERALS PO)        Current Facility-Administered Medications on File Prior to Encounter   Medication Dose Route Frequency Provider Last Rate Last Admin    lidocaine PF 1% (10 mg/mL) injection 0.2 mL  0.2 mL Injection PRN Philomena Course, MD             family history includes Arthritis in her maternal grandmother and mother; Back pain in her maternal grandfather and mother; Hypertension in her father and paternal grandmother; Joint Pain in her maternal grandmother and mother; Neck Pain in her mother; Stroke in her mother and paternal grandmother.      Social History     Socioeconomic History    Marital status: Married   Tobacco Use    Smoking status: Never    Smokeless tobacco: Never   Substance and Sexual Activity    Alcohol use: Never    Drug use: Never    Sexual activity: Yes     Partners: Male     Birth control/protection: Surgical                   PHYSICAL EXAM:    Vitals:    12/19/22 0704   BP: 123/78   BP Source: Arm, Right Upper   Pulse: 80   Temp: 36.6 ?C (97.9 ?F)   SpO2: 100%   O2 Device: None (Room air)   Weight: 104.3 kg (230 lb)   Height: 177.8 cm (5' 10)     Oswestry Total Score:: 42     Body mass index is 33 kg/m?Marland Kitchen    General: Alert, cooperative, no acute distress.  HEENT: Normocephalic, atraumatic.  Neck: Supple.  Lungs: Unlabored respirations, bilateral and equal chest excursion.  Heart: Regular rate.  Skin: Warm and dry to touch.  Abdomen: Nondistended.    Lumbar spine:  Appearance: No lesions or deformity  Lumbar tenderness: No  SI joint tenderness: Positive bilaterally  Pain with extension: No  Pain with lateral flexion: None  Sensation to light touch - left lower extremity: intact  Sensation to light touch - right lower extremity: intact  Straight leg raise: Negative bilaterally  FABER test: Positive bilaterally  FADIR test: Negative bilaterally  Lateral pelvic compression: Reproduces bilateral SI joint pain  Thigh thrust: Reproduces bilateral SI joint pain  Gaenslen test: Deferred    Lower extremity strength:   Root Right Left   Hip Flexion L2 5 5   Knee Flexion L5/S1 5 5   Knee Extension L3 5 5   Dorsiflexion L4 5 5   Plantarflexion S1 5 5   EHL Extension L5 5 5     Lower extremity reflexes:   Right Left   Patellar 2/4 2/4   Achilles 2/4 2/4       Neurological: Alert and oriented x3.       RADIOGRAPHIC EVALUATION:    MRI L spine 04/18/22: Disc bulge at L4-L5 with annular tear.  Mild central and lateral recess stenosis bilaterally.  No significant change compared to 2022 MRI.        MRI C spine 07/20/21  Subtle linear signal changes within the cord at C6 most suggestive of a  subtle syrinx. The remainder the cervical spine is unremarkable without   disc protrusion or stenosis.         IMPRESSION:    1. Sacroiliitis (HCC)    2. Polyarthralgia            PLAN:   Bilat S1-3 lateral branch RFA     General Pre Procedural Sedation ASA Classification      I have discussed risks and alternatives of this type of sedation and procedure with: patient    NPO Status:Acceptable    Pregnancy Status: No    Prior Anesthetic Types: General    Patient's had previous experience with anesthesia and/or sedation complications: No    Family history of sedation complications: No    Airway: Airway assessment performed Mallampati  II (soft palate, uvula, fauces visible)    Head and Neck: No abnormalities noted    Mouth: No abnormalities noted    Medications for Procedural Sedation: Midazolam and Fentanyl    Anesthesia Classification:  ASA II (A normal patient with mild systemic disease)    Patient remains a candidate for procedure: Yes    The intention for the procedure today is Procedural Sedation.

## 2022-12-19 NOTE — Discharge Instructions - Supplementary Instructions
GENERAL POST PROCEDURE INSTRUCTIONS  Physician: _________________________________  Procedure Completed Today:  Joint Injection (hip, knee, shoulder)  Cervical Epidural Steroid Injection  Cervical Transforaminal Steroid Injection  Trigger Point Injection  Caudal Epidural Steroid Injection  Piriformis Injection  Pudendal Nerve Block  Other _____________________ Thoracic Epidural Steroid Injection  Lumbar Epidural Steroid Injection  Lumbar Transforaminal Steroid Injection  Facet Joint Injection  Celiac Nerve Block  Sacrococcygeal  Sacroiliac Joint Injection   Important information following your procedure today:  You may drive today     If you had sedation, you may NOT drive today  Rest at home for the next 6 hours.  You may then begin to resume your normal activities.  DO NOT drive any vehicle, operate any power tools, drink alcohol, make any major decisions, or sign any legal documents for the next 12 hours.  Pain relief may not be immediate. It is possible you may even experience an increase in pain during the first 24-48 hours followed by a gradual decrease of your pain.  Though the procedure is generally safe, and complications are rare, we do ask that you be aware of any of the following:  Any swelling, persistent redness, new bleeding or drainage from the site of the injection.  You should not experience a severe headache.  You should not run a fever over 101oF.  New onset of sharp, severe back and or neck pain.  New onset of upper or lower extremity numbness or weakness.  New difficulty controlling bowel or bladder function after injection.  New shortness of breath.  ** If any of these occur, please call to report this occurrence to a nurse at [phone]. If you are calling after 4:00 p.m. or on weekends or holidays, please call [phone] and ask to have the resident physician on call for the physician paged or go to your local emergency room.  You may experience soreness at the injection site. Ice can be applied at 20-minute intervals for the first 24 hours. The following day you may alternate ice with heat if you are experiencing muscle tightness, otherwise continue with ice. Ice works best at decreasing pain. Avoid application of direct heat, hot showers or hot tubs today.  Avoid strenuous activity today. You many resume your regular activities and exercise tomorrow.  Patients with diabetes may see an elevation in blood sugars for 7-10 days after the injection. It is important to pay close attention to your diet, check your blood sugars daily and report extreme elevations to the physician that manages your diabetes.  Patients taking daily blood thinners can resume their regular dose this evening.  It is important that you take all medications ordered by your pain physician. Taking medications as ordered is an important part of your pain care plan. If you cannot continue the medication plan, please notify the physician.    Possible side effects to steroids that may occur:  Flushing or redness of the face  Irritability  Fluid retention  Change in women's menses  Minor headache    If you are unable to keep your upcoming appointment, please notify the [facility] scheduler at [phone] at least 24 hours in advance. If you have questions for the surgery center, call [facility] at [phone].

## 2022-12-19 NOTE — Procedures
Attending Surgeon: Philomena Course, MD    Anesthesia: Local    Pre-Procedure Diagnosis:   1. Sacroiliitis (HCC)    2. Polyarthralgia        Post-Procedure Diagnosis:   1. Sacroiliitis (HCC)    2. Polyarthralgia             DESTRUCTION OF OTHER PERIPHERAL NERVE OR BRANCH FLUORO    Laterality: bilateral    Location: Other (Comment) (sacral lateral branches S1-3)       Consent:   Consent obtained: written  Consent given by: Sophia Wood    Discussed with Sophia Wood the purpose of the treatment/procedure, other ways of treating my condition, including no treatment/ procedure and the risks and benefits of the alternatives. Sophia Wood has decided to proceed with treatment/procedure.        Universal Protocol:  Relevant documents: relevant documents present and verified  Test results: test results available and properly labeled  Imaging studies: imaging studies available  Required items: required blood products, implants, devices, and special equipment available  Site marked: the operative site was marked  Sophia Wood identity confirmed: Sophia Wood identify confirmed verbally with Sophia Wood.        Time out: Immediately prior to procedure a time out was called to verify the correct Sophia Wood, procedure, equipment, support staff and site/side marked as required      For Repeat RFA - Sophia Wood has had 50% Improvement for at least 6 Months  Sophia Wood has had 2 Medial Branch Blocks with greater than 80% relief that lasted more than 1 hour which is consistent with the local agent used.     Procedures Details:   Indications: pain     Prep: chlorhexidine    Sophia Wood position: prone  Estimated Blood Loss: minimal  Specimens: none      Guidance: fluoroscopy  Needle size: 18 G    Neurolytic Technique: Radiofrequency Ablation    Motor Testing: Motor testing complete per protocol   Injection procedure: Negative aspiration for blood and Incremental injection  Sophia Wood tolerance: Sophia Wood tolerated the procedure well with no immediate complications. Pressure was applied, and hemostasis was accomplished.  Comments: ANESTHESIA PROCEDURE REPORT    Radiofrequency Ablation (RFA) of Sacral Lateral Branch Nerves    Date of Service: 12/19/22     Procedure Title(s):    1. Radiofrequency ablation of the bilateral S1-3 lateral branch nerves   2. Intraoperative fluoroscopy    Attending Surgeon: Philomena Course, MD    Anesthesia: Local          Conscious Sedation Yes    Procedure in Detail: IV was started? Yes    The Sophia Wood was brought into the procedure room and placed in the prone position on the fluoroscopy table. Standard monitors were placed, and vital signs were observed throughout the procedure. The area of the lumbar spine and upper buttocks were prepped with chlorhexadine and draped in a sterile manner. AP fluoroscopy was used to identify and mark the mid point between the sacral spinous processes and the S1, S2, and S3 foramina on the right. The skin and subcutaneous tissues in these identified areas were anesthetized with 1% lidocaine. A 20-gauge, 5 inch, 10 mm active tip radiofrequency probe was advanced toward each of these points under fluoroscopic guidance using a superior to inferior angle of approach. Once bone was contacted, negative aspiration was confirmed. Motor stimulation at 2 Hz and 2 volts was negative. 2mL of lidocaine 1% was injected prior to lesioning, which was performed for 90 seconds at 80  degrees centigrade.     The same procedure was then performed on the opposite side: Yes.    The probes were removed with a 1% lidocaine flush. The Sophia Wood's back was cleaned, and bandages were placed at the needle insertion sites.    Disposition: The Sophia Wood tolerated the procedure well, and there were no apparent complications. Vital signs remained stable throughout the procedure. The Sophia Wood was taken to the recovery area where discharge instructions for the procedure were given.    Estimated Blood Loss: Minimal    Specimens: None    Complications: None        Radiofrequency Temperature 80      Administrations This Visit       fentaNYL citrate PF (SUBLIMAZE) injection 50-100 mcg       Admin Date  12/19/2022 Action  Given Dose  50 mcg Route  Intravenous Documented By  Randon Goldsmith, RN              lidocaine PF 1% (10 mg/mL) injection 15 mL       Admin Date  12/19/2022 Action  Given Dose  15 mL Route  Injection Documented By  Philomena Course, MD              midazolam (VERSED) injection 1-2 mg       Admin Date  12/19/2022 Action  Given Dose  2 mg Route  Intravenous Documented By  Randon Goldsmith, RN                  Estimated blood loss: none or minimal  Specimens: none  Sophia Wood tolerated the procedure well with no immediate complications. Pressure was applied, and hemostasis was accomplished.

## 2022-12-29 ENCOUNTER — Encounter: Admit: 2022-12-29 | Discharge: 2022-12-29 | Payer: BC Managed Care – PPO

## 2023-01-20 ENCOUNTER — Encounter: Admit: 2023-01-20 | Discharge: 2023-01-20 | Payer: BC Managed Care – PPO

## 2023-01-21 ENCOUNTER — Encounter: Admit: 2023-01-21 | Discharge: 2023-01-21 | Payer: BC Managed Care – PPO

## 2023-03-12 ENCOUNTER — Ambulatory Visit: Admit: 2023-03-12 | Discharge: 2023-03-12 | Payer: BC Managed Care – PPO

## 2023-03-12 ENCOUNTER — Encounter: Admit: 2023-03-12 | Discharge: 2023-03-12 | Payer: BC Managed Care – PPO

## 2023-03-12 DIAGNOSIS — R519 Generalized headaches: Secondary | ICD-10-CM

## 2023-03-12 DIAGNOSIS — K219 Gastro-esophageal reflux disease without esophagitis: Secondary | ICD-10-CM

## 2023-03-12 DIAGNOSIS — M5136 Other intervertebral disc degeneration, lumbar region: Secondary | ICD-10-CM

## 2023-03-12 DIAGNOSIS — D539 Nutritional anemia, unspecified: Secondary | ICD-10-CM

## 2023-03-12 DIAGNOSIS — R112 Nausea with vomiting, unspecified: Secondary | ICD-10-CM

## 2023-03-12 DIAGNOSIS — J302 Other seasonal allergic rhinitis: Secondary | ICD-10-CM

## 2023-03-12 DIAGNOSIS — M25649 Stiffness of unspecified hand, not elsewhere classified: Secondary | ICD-10-CM

## 2023-03-12 DIAGNOSIS — M461 Sacroiliitis, not elsewhere classified: Secondary | ICD-10-CM

## 2023-03-12 DIAGNOSIS — F32A Depression: Secondary | ICD-10-CM

## 2023-03-12 DIAGNOSIS — M545 Chronic bilateral low back pain, unspecified whether sciatica present: Secondary | ICD-10-CM

## 2023-03-12 DIAGNOSIS — M2550 Pain in unspecified joint: Secondary | ICD-10-CM

## 2023-03-12 DIAGNOSIS — M722 Plantar fascial fibromatosis: Secondary | ICD-10-CM

## 2023-03-12 DIAGNOSIS — F419 Anxiety disorder, unspecified: Secondary | ICD-10-CM

## 2023-03-12 DIAGNOSIS — M7918 Myalgia, other site: Secondary | ICD-10-CM

## 2023-03-12 DIAGNOSIS — M5030 Other cervical disc degeneration, unspecified cervical region: Secondary | ICD-10-CM

## 2023-03-12 LAB — URIC ACID: URIC ACID: 4.2 mg/dL (ref 2.0–7.0)

## 2023-03-12 LAB — RHEUMATOID FACTOR (RF): RF SCREEN: <10 [IU]/mL (ref <25)

## 2023-03-12 LAB — CCP IGG ANTIBODY

## 2023-03-12 LAB — CBC AND DIFF
ABSOLUTE BASO COUNT: 0 K/UL (ref 0–0.20)
ABSOLUTE EOS COUNT: 0 K/UL (ref 0–0.45)
ABSOLUTE LYMPH COUNT: 1.2 K/UL (ref 1.0–4.8)
ABSOLUTE MONO COUNT: 0.5 K/UL (ref 0–0.80)
BASOPHILS %: 1 % (ref 0–2)
HEMATOCRIT: 43 % (ref 36–45)
HEMOGLOBIN: 14 g/dL (ref 12.0–15.0)
LYMPHOCYTES %: 23 % — ABNORMAL LOW (ref 24–44)
MCH: 28 pg (ref 26–34)
MONOCYTES %: 10 % (ref 4–12)
MPV: 7.8 FL (ref 7–11)
PLATELET COUNT: 241 K/UL (ref 150–400)
RBC COUNT: 5.1 M/UL — ABNORMAL HIGH (ref 4.0–5.0)
RDW: 12 % (ref 11–15)
WBC COUNT: 5.6 K/UL (ref 4.5–11.0)

## 2023-03-12 LAB — COMPREHENSIVE METABOLIC PANEL
POTASSIUM: 3.9 MMOL/L (ref 3.5–5.1)
SODIUM: 142 MMOL/L (ref 137–147)

## 2023-03-12 LAB — SED RATE: ESR: 3 mm/h (ref 0–20)

## 2023-03-12 LAB — C REACTIVE PROTEIN (CRP): C-REACTIVE PROTEIN: 0.3 mg/dL (ref <1.0)

## 2023-03-12 NOTE — Progress Notes
Consult at the request of   Philomena Course, MD  [address]  [facility]  [place],  [place] [address]   Reason for consult: Sacroiliitis, polyarthralgia, myofascial pain    Subjective   CC:   HPI:   37 y.o. with GAD, sleeve gastrectomy, and cervical/lumbar degenerative disc disease who presents for evaluation of sacroiliitis.    Chart Review:  Has been following with spine center Dr. Evelena Leyden.  She he had bilateral SI joint ablation 01/2022 this improved her lateral low back and sacral pain, however, she has pain in the middle of the lower lumbar sacral region.  It is constant and worse with higher activity levels.  She had lumbar radiofrequency ablation in the past which increased her pain.  Steroid injections have caused severe body pain.  She had a lumbar spine 03/2022 and believes there might have been lumbar stenosis.  There was a concern for lumbar radiculopathy and she was started on pregabalin 50 mg 3 times daily.  Her psychiatrist recommended against it, so she did not take it.  She underwent repeat lateral sacral branches S1-S3 11/2022.    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    Today, says that she has LBP and is here for that. Has been moved from pain doc to pain doc. She remembers back pain even as a child. It got worse 3-4y ago. It was then every day. No specific cause. It is pain and stiffness. Feels like if she could stretch/pop, she would get relief. RFA's don't help.    No time of day is worse. Morning is the same. Night bothers her b/c she can't sleep but severity is the same. Movement doesn't make it better. Pain wakes her up at night. Not stiffness. No alternating buttock pain. Severity can switch sides but duration is daily usually.     No uvieitis, no sx of uveitis, personal/family PsO/IBD, dactylitis, sx of IBD,   Occasional achilles tendinopathy which lasts a week. Occurred 2x over last 26m. Has had plantar fascitis and surgery blt x2.     Used to take aleve 440 BID for HA but didn't help LBP. She had injection for the plantar fascitis which caused crushing joint pain. She had a reaction to flonase as well.     PIP>>MCP stiffness w/o significant pain. Worse in am and EMS 38m. Doesn't hurt every day. Has CMC pain as well.        ROS:  Review of Systems   Constitutional:  Positive for fatigue. Negative for activity change, appetite change, chills, diaphoresis, fever and unexpected weight change.   HENT:  Positive for congestion, ear discharge, postnasal drip and tinnitus. Negative for dental problem, drooling, ear pain, facial swelling, hearing loss, mouth sores, nosebleeds, rhinorrhea, sinus pressure, sinus pain, sneezing, sore throat, trouble swallowing and voice change.    Eyes:  Positive for visual disturbance. Negative for photophobia, pain, discharge, redness and itching.   Respiratory:  Negative for apnea, cough, choking, chest tightness, shortness of breath, wheezing and stridor.    Cardiovascular:  Negative for chest pain, palpitations and leg swelling.   Endocrine: Negative for cold intolerance, heat intolerance, polydipsia, polyphagia and polyuria.   Genitourinary:  Negative for decreased urine volume, difficulty urinating, dyspareunia, dysuria, enuresis, flank pain, frequency, genital sores, hematuria, menstrual problem, pelvic pain, urgency, vaginal bleeding, vaginal discharge and vaginal pain.   Musculoskeletal:  Positive for arthralgias, back pain, joint swelling, neck pain and neck stiffness. Negative for gait problem  and myalgias.   Skin:  Negative for color change, pallor, rash and wound.   Allergic/Immunologic: Positive for environmental allergies. Negative for food allergies and immunocompromised state.   Neurological:  Positive for headaches. Negative for dizziness, tremors, seizures, syncope, facial asymmetry, speech difficulty, weakness, light-headedness and numbness.   Hematological:  Negative for adenopathy. Does not bruise/bleed easily. Psychiatric/Behavioral:  Positive for sleep disturbance. Negative for agitation, behavioral problems, confusion, decreased concentration, dysphoric mood, hallucinations, self-injury and suicidal ideas. The patient is nervous/anxious. The patient is not hyperactive.    All other systems reviewed and are negative.      PMH:  Past Medical History:   Diagnosis Date    Anxiety     Degenerative disc disease, cervical     Degenerative disc disease, lumbar     Depression 2008    Remission    Generalized headaches 1995    GERD (gastroesophageal reflux disease) 06/2022    Post gastric sleeve    Joint pain     PONV (postoperative nausea and vomiting)     Seasonal allergic reaction     Unspecified deficiency anemia        PSH:  Surgical History:   Procedure Laterality Date    ABDOMEN SURGERY      BREAST SURGERY  2015, 2018, 2019    Biopsies x 3, reduction    HX ARTHROSCOPIC SURGERY      B) knees    HX CESAREAN SECTION  2020    HX CHOLECYSTECTOMY  2018    HX HYSTERECTOMY  2021    HX TONSILLECTOMY  2022    HX TUBAL LIGATION  2020    KNEE SURGERY  2012, 2020       SH:  Denies S/A/D    FH:  GM: RA  Sister: Vasculitis NOS.       MEDICATIONS:  Outpatient Encounter Medications as of 03/12/2023   Medication Sig Dispense Refill    [DISCONTINUED] calcium carbonate (CALCIUM 500) 500 mg calcium (1,250 mg) chewable tablet       [DISCONTINUED] CHOLEcalciferoL (vitamin D3) (VITAMIN D3) 5000 unit tablet       desvenlafaxine succinate (PRISTIQ) 100 mg tablet Take one tablet by mouth every morning.      LORazepam (ATIVAN) 1 mg tablet Take one tablet by mouth every 6 hours as needed.      [DISCONTINUED] multivit with iron,minerals (MULTIVITAMIN AND MINERALS PO)        Facility-Administered Encounter Medications as of 03/12/2023   Medication Dose Route Frequency Provider Last Rate Last Admin    fentaNYL citrate PF (SUBLIMAZE) injection 50-100 mcg  50-100 mcg Intravenous Q2 MIN PRN Philomena Course, MD   50 mcg at 12/19/22 0742    lidocaine PF 1% (10 mg/mL) injection 0.2 mL  0.2 mL Injection PRN Philomena Course, MD        midazolam (VERSED) injection 1-2 mg  1-2 mg Intravenous Q2 MIN PRN Philomena Course, MD   2 mg at 12/19/22 9604       Allergies:  Flonase [fluticasone], Corticosteroids (glucocorticoids), and Latex     PHYSICAL EXAM:  BP 122/70  - Pulse 83  - Temp 36.5 ?C (97.7 ?F)  - Resp 18  - Ht 177.8 cm (5' 10)  - Wt 101.6 kg (224 lb)  - SpO2 100%  - BMI 32.14 kg/m?     GEN: NAD, Alert  HENT: MMM, EOMI  CV: RRR, no murmurs or rubs   PULM: CTAB, no crackles  MSK: No joint erythema or tenderness, no synovitis, normal joint ROM, no achilles swelling. Mild SI joint TTP  EXT: No LEE. Warm.  SKIN: No rash or lesions. Normal turgor  PSYCH: Normal affect, normal mood    LABS:      IMAGING:  Lumbar spine without 06/2020  Mild lumbar spondylosis, worst at L4-5 as detailed above.    C-spine MRI without 06/2020  Mild cervical spondylosis. No significant spinal canal stenosis or neural foraminal narrowing     Lumbar spine MRI 06/2021  L5/S1: There is mild concentric disc bulge with facet arthropathy ligamentum flavum hypertrophy.  There is no significant central canal stenosis or neural foraminal narrowing at this level.    1.mild lumbar spondylosis.  Facet arthropathy and ligamentum flavum Hydrafruit hypertrophy at multiple levels.  No acute osseous abnormalities demonstrated.  2.at the L4-L5 level, there is a concentric disc bulge with a tiny focal annular tear which effaces the anterior thecal sac at this level.  There is facet arthropathy ligamentum flavum hypertrophy at this level.  There is at least mild or mild to moderate central canal stenosis at this level.  3.there is no significant neuroforaminal narrowing at any level.       ASSESSMENT/PLAN:  1. Chronic bilateral low back pain, unspecified whether sciatica present    2. Plantar fasciitis    3. Stiffness of hand joint, unspecified laterality      Orders Placed This Encounter    SI JOINTS MIN 3 VIEWS HLA-B27, today         37 y.o. with GAD, sleeve gastrectomy, and cervical/lumbar degenerative disc disease who presents for evaluation of sacroiliitis.    Patient arrives for evaluation of chronic low back pain.  This began as a child but significantly worsened over the past 3 to 4 years.  It bothers her throughout the day, and is worse with standing.  It does not have significant inflammatory features.  She does not remember ever having significant inflammatory back pain features.    She has had a significant history of blt plantar fasciitis requiring surgical intervention.  She can have occasional Achilles enthesitis.  She denies a personal/family history of PsO/IBD, uveitis, IBD, or dactylitis.  She does have mild inflammatory symptoms in the PIPs but this does not bother her very much compared to the back.    Overall, noninflammatory back pain and significant enthesitis in this young patient.  Will check SI radiograph and HLA-B27 in addition to inflammatory markers.  Pending this, could consider MRI of the pelvis if any are positive.  We briefly discussed avoiding NSAIDs given her recent sleeve gastrectomy and probable immunosuppressive medicine if axial spondylarthritis was suggested.    RTC as needed pending labs.       Tiana Loft MD, PhD

## 2023-03-17 ENCOUNTER — Encounter: Admit: 2023-03-17 | Discharge: 2023-03-17 | Payer: BC Managed Care – PPO

## 2023-03-17 DIAGNOSIS — M545 Chronic bilateral low back pain, unspecified whether sciatica present: Secondary | ICD-10-CM

## 2023-03-17 DIAGNOSIS — Z1589 Genetic susceptibility to other disease: Secondary | ICD-10-CM

## 2023-03-17 DIAGNOSIS — M722 Plantar fascial fibromatosis: Secondary | ICD-10-CM

## 2023-03-18 ENCOUNTER — Encounter: Admit: 2023-03-18 | Discharge: 2023-03-18 | Payer: BC Managed Care – PPO

## 2023-03-31 ENCOUNTER — Encounter: Admit: 2023-03-31 | Discharge: 2023-03-31 | Payer: BC Managed Care – PPO

## 2023-04-02 ENCOUNTER — Encounter: Admit: 2023-04-02 | Discharge: 2023-04-02 | Payer: BC Managed Care – PPO

## 2023-04-02 DIAGNOSIS — Z1589 Genetic susceptibility to other disease: Secondary | ICD-10-CM

## 2023-04-02 DIAGNOSIS — M545 Chronic bilateral low back pain, unspecified whether sciatica present: Secondary | ICD-10-CM

## 2023-04-13 ENCOUNTER — Encounter: Admit: 2023-04-13 | Discharge: 2023-04-13 | Payer: BC Managed Care – PPO

## 2023-07-07 ENCOUNTER — Encounter: Admit: 2023-07-07 | Discharge: 2023-07-07 | Payer: BC Managed Care – PPO

## 2023-07-08 ENCOUNTER — Encounter: Admit: 2023-07-08 | Discharge: 2023-07-08 | Payer: BC Managed Care – PPO

## 2024-01-20 ENCOUNTER — Encounter: Admit: 2024-01-20 | Discharge: 2024-01-20 | Payer: BLUE CROSS/BLUE SHIELD

## 2024-01-26 ENCOUNTER — Ambulatory Visit: Admit: 2024-01-26 | Discharge: 2024-01-27 | Payer: BLUE CROSS/BLUE SHIELD

## 2024-01-26 ENCOUNTER — Encounter: Admit: 2024-01-26 | Discharge: 2024-01-26 | Payer: BLUE CROSS/BLUE SHIELD

## 2024-03-18 ENCOUNTER — Encounter: Admit: 2024-03-18 | Discharge: 2024-03-18 | Payer: BLUE CROSS/BLUE SHIELD

## 2024-03-18 NOTE — Telephone Encounter
 Spoke with patient about her upcoming consult with the resident clinic and that they no longer offer combo surgeries. Discussed if she would like to be seen by an attending we can schedule that and schedule an appointment with Dr. Wilhelm.

## 2024-05-03 ENCOUNTER — Encounter: Admit: 2024-05-03 | Discharge: 2024-05-03 | Payer: BLUE CROSS/BLUE SHIELD

## 2024-05-10 ENCOUNTER — Encounter: Admit: 2024-05-10 | Discharge: 2024-05-10 | Payer: BLUE CROSS/BLUE SHIELD

## 2024-05-12 ENCOUNTER — Encounter: Admit: 2024-05-12 | Discharge: 2024-05-12 | Payer: BLUE CROSS/BLUE SHIELD

## 2024-05-28 ENCOUNTER — Encounter: Admit: 2024-05-28 | Discharge: 2024-05-28 | Payer: BLUE CROSS/BLUE SHIELD

## 2024-06-14 ENCOUNTER — Encounter: Admit: 2024-06-14 | Discharge: 2024-06-14 | Payer: BLUE CROSS/BLUE SHIELD

## 2024-07-02 ENCOUNTER — Encounter: Admit: 2024-07-02 | Discharge: 2024-07-02 | Payer: BLUE CROSS/BLUE SHIELD

## 2024-07-05 ENCOUNTER — Encounter: Admit: 2024-07-05 | Discharge: 2024-07-05 | Payer: BLUE CROSS/BLUE SHIELD

## 2024-07-05 DIAGNOSIS — M5416 Radiculopathy, lumbar region: Principal | ICD-10-CM

## 2024-07-07 ENCOUNTER — Encounter: Admit: 2024-07-07 | Discharge: 2024-07-07 | Payer: BLUE CROSS/BLUE SHIELD

## 2024-07-09 ENCOUNTER — Ambulatory Visit: Admit: 2024-07-09 | Discharge: 2024-07-09 | Payer: BLUE CROSS/BLUE SHIELD

## 2024-07-09 ENCOUNTER — Encounter: Admit: 2024-07-09 | Discharge: 2024-07-09 | Payer: BLUE CROSS/BLUE SHIELD

## 2024-07-09 DIAGNOSIS — M48061 Spinal stenosis, lumbar region without neurogenic claudication: Secondary | ICD-10-CM

## 2024-07-09 DIAGNOSIS — M5416 Radiculopathy, lumbar region: Secondary | ICD-10-CM

## 2024-07-09 DIAGNOSIS — M51362 Degeneration of intervertebral disc of lumbar region with discogenic back pain and lower extremity pain: Secondary | ICD-10-CM

## 2024-07-09 NOTE — Progress Notes
 SPINE CENTER HISTORY AND PHYSICAL      CHIEF COMPLAINT   No chief complaint on file.         HISTORY OF PRESENT ILLNESS   Sophia Wood is a 38 y.o. female.  38 year old female comes today for further evaluation of back and bilateral leg pain.  Patient notes that this been ongoing for some time.  She has tried numerous conservative measures including injections and radiofrequency ablation.  She has tried physical therapy and activity modification.  This is mostly back pain especially with extension in addition to more recently pain that extends down her buttocks posterior legs typically not past her knees.  No previous lumbar surgery          SPINE COMPREHENSIVE HISTORY FORM    Pain:  Where is your worst symptom?: (Patient-Rptd) Lower back  Words that best describe the quality of your symptom:: (Patient-Rptd) Tiring; Other  What other words would you use to describe your symptoms?: (Patient-Rptd) Crushing  Are you having any pain?: (Patient-Rptd) Yes  When did your pain start?: (Patient-Rptd) Greater than 1 year  Have you been given a diagnosis for your pain?: (Patient-Rptd) Yes  Explanation of diagnosis:: (Patient-Rptd) Stenosis, arthritis, etc  Is the pain constant or does it come and go?: (Patient-Rptd) Constant  Does the pain move into your arm or leg?: (Patient-Rptd) Yes  Explanation of how far pain moves:: (Patient-Rptd) Down the back of both legs at times, occasionally in front of thighs to knees  Is this work related?: (Patient-Rptd) No  Was there a cause of injury (eg. fall, MVA, heavy lifting)?: (Patient-Rptd) No  What makes the symptom better? (eg. rest, sitting down, ice, heat, medications):: (Patient-Rptd) Rest; Heat; Other (comment)  Other reason that makes the pain better?: (Patient-Rptd) Curl into ball to relieve pressure on spine  What makes the symptom worse? (eg. sit, stand, walk, bend):: (Patient-Rptd) Stand; Walk  Do you have numbness in your arm or leg?: (Patient-Rptd) None  Do you have weakness in your arm or leg?: (Patient-Rptd) Leg  Explanation of weakness in leg from start point to end point:: (Patient-Rptd) Hip; Knee  Have you lost control of bladder or bowel function?: (Patient-Rptd) No  Is the pain worse at night?: (Patient-Rptd) No  Have you had any unplanned weight loss?: (Patient-Rptd) No      Pain Scale:  If you are here for pain. Where is the pain?: (Patient-Rptd) Low back  On a scale of 0 to 10, how bad is your low back pain?: (Patient-Rptd) 6  On a scale of 0 to 10, what was your average low back pain level in the past week?: (Patient-Rptd) 6           Tests for this condition:  Have you had any of the following tests for this condition?: (Patient-Rptd) X-ray; MRI; Nerve study/EMG  What was the approximate date of X-ray?: (Patient-Rptd) 06/11/24  List the facility where xray took place:: (Patient-Rptd) [facility]  What was the approximate date of MRI?: (Patient-Rptd) 04/16/24  List the facility where MRI took place:: (Patient-Rptd) [facility]  What was the approximate date of Nerve conduction/EMG?: (Patient-Rptd) 08/14/23  List the facility where Nerve conduction/EMG took place:: (Patient-Rptd) [facility]      Treatment(s) tried for this condition:  What conservative therapies have you done for this condition?: (Patient-Rptd) Physical therapy; Injection  List the facility where you received physical therapy?: (Patient-Rptd) [facility]  What was the approximate date physical therapy  was started?: (Patient-Rptd) 06/11/24  How long did you do physical therapy? (in weeks): (Patient-Rptd) 4  What was the approximate date of your last injection?: (Patient-Rptd) 01/22/24  What type of spine injection have you had in the past?: (Patient-Rptd) Burning of nerve; Epidural steroid injection; Facet injection; Trigger point  Have you had surgery relevant to pain?: (Patient-Rptd) No      Medications  Are you currently or in the past taken the following prescribed medications and found them ineffective in controlling your symptoms?: (Patient-Rptd) Gabapentin (Neurontin); Naproxen (Aleve); Pregabalin  (Lyrica ); Tompiramate (Topamax)      Blood Thinner  Are you taking one of the following blood thinners?: (Patient-Rptd) No                      PAST MEDICAL HISTORY     Past Medical History:    Allergy    Anxiety    Arthritis    Breast disorder    Degenerative disc disease, cervical    Degenerative disc disease, lumbar    Degenerative disc disease, thoracic    Depression    Generalized headaches    GERD (gastroesophageal reflux disease)    Joint pain    PONV (postoperative nausea and vomiting)    Seasonal allergic reaction    Spinal stenosis    Unspecified deficiency anemia       PAST SURGICAL HISTORY     Surgical History:   Procedure Laterality Date    KNEE SURGERY  01/09/2024    ABDOMEN SURGERY      BREAST SURGERY  2015, 2018, 2019    Biopsies x 3, reduction    CHOLECYSTECTOMY      HX ARTHROSCOPIC SURGERY      B) knees    HX CESAREAN SECTION  2020    2020    HX CHOLECYSTECTOMY  2018    HX HYSTERECTOMY  2021    HX TONSILLECTOMY  2022    HX TUBAL LIGATION  2020    KNEE SURGERY  2012, 2020       FAMILY HISTORY   family history includes Arthritis in her maternal grandmother and mother; Arthritis-osteo in her mother; Arthritis-rheumatoid in her maternal grandmother; Back pain in her maternal grandfather and mother; Cancer-Breast in her maternal grandmother; Diabetes in her father and maternal grandmother; Hypertension in her father and paternal grandmother; Joint Pain in her maternal grandmother and mother; Migraines in her mother; Neck Pain in her mother; Neurologic Disorder in her mother; Seizures in her mother; Stroke in her mother and paternal grandmother.      SOCIAL HISTORY     Social History     Socioeconomic History    Marital status: Married   Tobacco Use    Smoking status: Never    Smokeless tobacco: Never   Substance and Sexual Activity Alcohol use: Never    Drug use: Never    Sexual activity: Yes     Partners: Male     Birth control/protection: Surgical       ALLERGIES   Allergies[1]    MEDICATIONS   Current Medications[2]    REVIEW OF SYSTEMS   Review of Systems  As noted in HPI      PHYSICAL EXAM   There were no vitals filed for this visit.  Oswestry Total Score:: (Patient-Rptd) 50     There is no height or weight on file to calculate BMI.    Constitutional: Alert, NAD  Psychiatric: Mood and affect appropriate  Eyes: EOMI  Respiratory: Unlabored breathing  Cardiovascular: Regular rate  Skin: No rashes or open wounds appreciated on back  Musculoskeletal: 5/5 strength throughout bilat LEs (hip flexion, knee ext/flexion, ankle DF/PF, great toe extension), lumbar ROM limited secondary to pain especially with extension, negative straight leg raise bilat  Neurologic: Sensation intact to light touch L2-S1 , no hyperreflexia, no ankle clonus, downgoing Babinski      RADIOGRAPHIC EVALUATION     Review of the patient's imaging demonstrates a degenerative disc at L4-5.  I think her foraminal stenosis is moderate to severe and somewhat worse than it was back in 23 other levels look good no instability although she may have a subtle slip on her standing films at L5-S1       ASSESSMENT / PLAN     Sophia Wood is a 38 y.o. 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  I think ultimately she is try to extensive course of nonoperative measures and continues to have symptoms.  I think that given the degree of back pain this would likely be a fusion operation I think this would either be an all posterior based operation L4-5 with a TLIF robotically assisted versus a lateral from the right and posterior L4-5 decompression fusion infuse allograft robotically assisted I think either 1 would be options I tend to think I would probably lean towards the lateral although I would want a look at his CT scan before.  I think she is also debating whether she wants to go through her surgery and I think to help give a clear picture organ to get a CT SPECT scan to see if this is a likely pain generator for her at L4-5.  Will plan on having her come back to see us  after that.  She also had briefly seen a rheumatologist in the past.  This really did not lead to any formal diagnosis but she is interested in getting a second opinion and I think that is reasonable.  She is going to do that in the meantime as well                [1]   Allergies  Allergen Reactions    Flonase [Fluticasone] RASH    Corticosteroids (Glucocorticoids) SEE COMMENTS     Joint pain    Latex ITCHING   [2]   Current Outpatient Medications:     desvenlafaxine succinate (PRISTIQ) 100 mg tablet, Take one tablet by mouth every morning., Disp: , Rfl:     LORazepam (ATIVAN) 1 mg tablet, Take one tablet by mouth every 6 hours as needed., Disp: , Rfl:     Current Facility-Administered Medications:     fentaNYL  citrate PF (SUBLIMAZE ) injection 50-100 mcg, 50-100 mcg, Intravenous, Q2 MIN PRN, Jackline Prentice HERO, MD, 50 mcg at 12/19/22 9257    lidocaine  PF 1% (10 mg/mL) injection 0.2 mL, 0.2 mL, Injection, PRN, Jackline Prentice HERO, MD    midazolam  (VERSED ) injection 1-2 mg, 1-2 mg, Intravenous, Q2 MIN PRN, Jackline Prentice HERO, MD, 2 mg at 12/19/22 [phone]

## 2024-07-12 ENCOUNTER — Encounter: Admit: 2024-07-12 | Discharge: 2024-07-12 | Payer: BLUE CROSS/BLUE SHIELD

## 2024-07-21 ENCOUNTER — Ambulatory Visit: Admit: 2024-07-21 | Discharge: 2024-07-21 | Payer: BLUE CROSS/BLUE SHIELD

## 2024-07-21 ENCOUNTER — Encounter: Admit: 2024-07-21 | Discharge: 2024-07-21 | Payer: BLUE CROSS/BLUE SHIELD

## 2024-08-08 ENCOUNTER — Encounter: Admit: 2024-08-08 | Discharge: 2024-08-08 | Payer: BLUE CROSS/BLUE SHIELD

## 2024-08-11 ENCOUNTER — Encounter: Admit: 2024-08-11 | Discharge: 2024-08-11 | Payer: BLUE CROSS/BLUE SHIELD

## 2024-08-11 ENCOUNTER — Ambulatory Visit: Admit: 2024-08-11 | Discharge: 2024-08-12 | Payer: BLUE CROSS/BLUE SHIELD
